# Patient Record
Sex: Male | Born: 1950
Health system: Southern US, Community
[De-identification: ages and names within clinical notes are randomized; demographics above are authoritative.]

## PROBLEM LIST (undated history)

## (undated) DIAGNOSIS — G43909 Migraine, unspecified, not intractable, without status migrainosus: Secondary | ICD-10-CM

## (undated) DIAGNOSIS — I10 Essential (primary) hypertension: Secondary | ICD-10-CM

## (undated) DIAGNOSIS — K219 Gastro-esophageal reflux disease without esophagitis: Secondary | ICD-10-CM

## (undated) DIAGNOSIS — M19019 Primary osteoarthritis, unspecified shoulder: Secondary | ICD-10-CM

## (undated) DIAGNOSIS — T8859XA Other complications of anesthesia, initial encounter: Secondary | ICD-10-CM

## (undated) DIAGNOSIS — N400 Enlarged prostate without lower urinary tract symptoms: Secondary | ICD-10-CM

## (undated) DIAGNOSIS — M1990 Unspecified osteoarthritis, unspecified site: Secondary | ICD-10-CM

## (undated) DIAGNOSIS — R112 Nausea with vomiting, unspecified: Secondary | ICD-10-CM

## (undated) DIAGNOSIS — E785 Hyperlipidemia, unspecified: Secondary | ICD-10-CM

## (undated) DIAGNOSIS — Z9889 Other specified postprocedural states: Secondary | ICD-10-CM

## (undated) DIAGNOSIS — G4730 Sleep apnea, unspecified: Secondary | ICD-10-CM

## (undated) DIAGNOSIS — T4145XA Adverse effect of unspecified anesthetic, initial encounter: Secondary | ICD-10-CM

## (undated) HISTORY — DX: Benign prostatic hyperplasia without lower urinary tract symptoms: N40.0

## (undated) HISTORY — DX: Hyperlipidemia, unspecified: E78.5

## (undated) HISTORY — DX: Essential (primary) hypertension: I10

## (undated) HISTORY — PX: KNEE SURGERY: SHX244

## (undated) HISTORY — PX: CHOLECYSTECTOMY: SHX55

## (undated) HISTORY — DX: Unspecified osteoarthritis, unspecified site: M19.90

## (undated) HISTORY — DX: Sleep apnea, unspecified: G47.30

## (undated) HISTORY — DX: Gastro-esophageal reflux disease without esophagitis: K21.9

## (undated) HISTORY — PX: INGUINAL HERNIA REPAIR: SUR1180

## (undated) HISTORY — DX: Migraine, unspecified, not intractable, without status migrainosus: G43.909

## (undated) HISTORY — DX: Morbid (severe) obesity due to excess calories: E66.01

---

## 2002-01-08 ENCOUNTER — Ambulatory Visit (HOSPITAL_COMMUNITY): Admission: RE | Admit: 2002-01-08 | Discharge: 2002-01-08 | Payer: Self-pay | Admitting: General Surgery

## 2002-01-08 ENCOUNTER — Encounter: Payer: Self-pay | Admitting: General Surgery

## 2003-06-14 ENCOUNTER — Ambulatory Visit (HOSPITAL_COMMUNITY): Admission: RE | Admit: 2003-06-14 | Discharge: 2003-06-14 | Payer: Self-pay | Admitting: Family Medicine

## 2003-07-09 ENCOUNTER — Ambulatory Visit (HOSPITAL_COMMUNITY): Admission: RE | Admit: 2003-07-09 | Discharge: 2003-07-10 | Payer: Self-pay

## 2003-11-11 ENCOUNTER — Observation Stay (HOSPITAL_COMMUNITY): Admission: RE | Admit: 2003-11-11 | Discharge: 2003-11-12 | Payer: Self-pay | Admitting: Orthopedic Surgery

## 2003-11-19 ENCOUNTER — Ambulatory Visit (HOSPITAL_BASED_OUTPATIENT_CLINIC_OR_DEPARTMENT_OTHER): Admission: RE | Admit: 2003-11-19 | Discharge: 2003-11-19 | Payer: Self-pay | Admitting: Otolaryngology

## 2010-12-21 ENCOUNTER — Ambulatory Visit: Payer: Self-pay | Admitting: Medical

## 2011-08-08 ENCOUNTER — Encounter: Payer: Self-pay | Admitting: Cardiology

## 2011-08-08 ENCOUNTER — Ambulatory Visit (INDEPENDENT_AMBULATORY_CARE_PROVIDER_SITE_OTHER): Payer: BC Managed Care – PPO | Admitting: Cardiology

## 2011-08-08 VITALS — BP 140/100 | HR 77 | Ht 70.0 in | Wt 304.0 lb

## 2011-08-08 DIAGNOSIS — E785 Hyperlipidemia, unspecified: Secondary | ICD-10-CM

## 2011-08-08 DIAGNOSIS — I452 Bifascicular block: Secondary | ICD-10-CM

## 2011-08-08 DIAGNOSIS — I447 Left bundle-branch block, unspecified: Secondary | ICD-10-CM

## 2011-08-08 DIAGNOSIS — I10 Essential (primary) hypertension: Secondary | ICD-10-CM

## 2011-08-08 MED ORDER — METOPROLOL TARTRATE 25 MG PO TABS: 25.0000 mg | ORAL_TABLET | Freq: Two times a day (BID) | ORAL | Status: AC

## 2011-08-08 NOTE — Progress Notes (Signed)
HPI The patient presents for evaluation of an abnormal EKG. He was found recently to have a left bundle branch block. This was not present on her previous EKG. He has not had any cardiac diagnosis in the past. He does have long-standing hypertension and sleep apnea. He did have a stress test in 2001. This is a treadmill test and he subsequently had a stress perfusion study though I don't have these results. He reports that the perfusion study was normal. He has not had any acute cardiovascular symptoms. He denies any chest pressure, or neck discomfort. He does not have any palpitations, presyncope or syncope. He denies any PND or orthopnea. Unfortunately he is very limited because of joint pains. He also describes diffuse muscle aches with limited activity. He does have some discomfort occasionally in his jaw or teeth. He does say he grinds his teeth.  Allergies  Allergen Reactions  . Allopurinol   . Sulfa Drugs Cross Reactors     Current Outpatient Prescriptions  Medication Sig Dispense Refill  . amitriptyline (ELAVIL) 50 MG tablet Take 50 mg by mouth at bedtime.      Marland Kitchen amLODipine (NORVASC) 10 MG tablet       . aspirin 81 MG tablet Take 81 mg by mouth daily.      . butabarbital (BUTISOL) 50 MG TABS Take 50 mg by mouth.      . butalbital-acetaminophen-caffeine (FIORICET, ESGIC) 50-325-40 MG per tablet       . CALCIUM PO Take 600 mg by mouth 2 (two) times daily.      . Cholecalciferol (VITAMIN D-3 PO) Take by mouth daily.      . cloNIDine (CATAPRES) 0.1 MG tablet       . DEPO-TESTOSTERONE 200 MG/ML injection       . hydrochlorothiazide (HYDRODIURIL) 25 MG tablet Take 25 mg by mouth daily.      Marland Kitchen HYDROcodone-acetaminophen (NORCO) 5-325 MG per tablet Take 1 tablet by mouth every 6 (six) hours as needed.      Marland Kitchen lisinopril (PRINIVIL,ZESTRIL) 40 MG tablet       . meloxicam (MOBIC) 15 MG tablet Take 15 mg by mouth daily.      . Multiple Vitamin (MULTIVITAMIN) tablet Take 1 tablet by mouth daily.       Marland Kitchen omega-3 acid ethyl esters (LOVAZA) 1 G capsule Take 2 g by mouth daily.      Marland Kitchen omeprazole (PRILOSEC) 20 MG capsule Take 20 mg by mouth daily.      . potassium chloride SA (K-DUR,KLOR-CON) 20 MEQ tablet Take 20 mEq by mouth 2 (two) times daily.      . Tamsulosin HCl (FLOMAX) 0.4 MG CAPS       . vardenafil (LEVITRA) 20 MG tablet Take 20 mg by mouth daily as needed.        Past Medical History  Diagnosis Date  . Sleep apnea   . Morbid obesity   . HTN (hypertension)   . GERD (gastroesophageal reflux disease)   . Hyperlipidemia   . Migraines   . Osteoarthritis   . BPH (benign prostatic hyperplasia)     Past Surgical History  Procedure Date  . Knee surgery     x 2  . Inguinal hernia repair     x 2  . Cholecystectomy     Family History  Problem Relation Age of Onset  . Coronary artery disease Father 84  . Stroke Mother 32    Died age 3  . Hypertension  Mother     History   Social History  . Marital Status: Unknown    Spouse Name: N/A    Number of Children: N/A  . Years of Education: N/A   Occupational History  . Teacher    Social History Main Topics  . Smoking status: Never Smoker   . Smokeless tobacco: Not on file  . Alcohol Use: Not on file  . Drug Use: Not on file  . Sexually Active: Not on file   Other Topics Concern  . 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    ROS:  Postive  dizziness, reflux, joint pains, seasonal allergies, edema. Forty pound intentional weight loss.  Otherwise as stated in the history of present illness and negative for all other systems.  PHYSICAL EXAM BP 140/100  Pulse 77  Ht 5\' 10"  (1.778 m)  Wt 304 lb (137.893 kg)  BMI 43.62 kg/m2 GENERAL:  Well appearing HEENT:  Pupils equal round and reactive, fundi not visualized, oral mucosa unremarkable NECK:  No jugular venous distention, waveform within normal limits, carotid upstroke brisk and symmetric, no bruits, no thyromegaly LYMPHATICS:  No  cervical, inguinal adenopathy LUNGS:  Clear to auscultation bilaterally BACK:  No CVA tenderness CHEST:  Unremarkable HEART:  PMI not displaced or sustained,S1 and S2 within normal limits, no S3, no S4, no clicks, no rubs, no murmurs ABD:  Flat, positive bowel sounds normal in frequency in pitch, no bruits, no rebound, no guarding, no midline pulsatile mass, no hepatomegaly, no splenomegaly EXT:  2 plus pulses throughout, no edema, no cyanosis no clubbing SKIN:  No rashes no nodules NEURO:  Cranial nerves II through XII grossly intact, motor grossly intact throughout PSYCH:  Cognitively intact, oriented to person place and time  EKG:  Sinus rhythm, rate 77, left bundle branch block. The LBBB is new compared with an EKG in 2001.  08/08/2011     ASSESSMENT AND PLAN

## 2011-08-08 NOTE — Patient Instructions (Signed)
The current medical regimen is effective;  continue present plan and medications.  Non-Cardiac CT Angiography (CTA), is a special type of CT scan that uses a computer to produce multi-dimensional views of major blood vessels throughout the body. In CT angiography, a contrast material is injected through an IV to help visualize the blood vessels.  Please take metoprolol 25 mg one twice a day start 2 days prior to your procedure.  Continue all other medications as listed.  Follow up as needed.

## 2011-08-08 NOTE — Assessment & Plan Note (Signed)
His blood pressure is elevated today. However, I reviewed previous blood pressures and these have not been high. He takes his blood pressure at home and is well controlled. He will keep a blood pressure diary and continue with weight loss. No change in therapy is indicated.

## 2011-08-08 NOTE — Assessment & Plan Note (Signed)
I congratulated him on his weight loss to date. I encouraged more of the same.

## 2011-08-08 NOTE — Assessment & Plan Note (Signed)
His last LDL was 156. His HDL 39. He has not tolerated statins.  This will be followed by Redmond Baseman, MD with diet and fish oil.

## 2011-08-08 NOTE — Assessment & Plan Note (Signed)
He has a new left bundle branch block though there are no recent EKGs. He has cardiovascular risk factors and some jaw discomfort. Given this I need to exclude obstructive coronary disease. He has baseline EKG abnormality as above and would not be an exercise. I will suggest for him coronary CT angiography to exclude obstructive coronary disease. He needs primary risk reduction.

## 2011-08-09 ENCOUNTER — Other Ambulatory Visit: Payer: Self-pay | Admitting: Cardiology

## 2011-08-09 DIAGNOSIS — I447 Left bundle-branch block, unspecified: Secondary | ICD-10-CM

## 2011-08-13 ENCOUNTER — Other Ambulatory Visit: Payer: Self-pay | Admitting: *Deleted

## 2011-08-13 DIAGNOSIS — I454 Nonspecific intraventricular block: Secondary | ICD-10-CM

## 2011-08-28 ENCOUNTER — Encounter (HOSPITAL_COMMUNITY): Payer: BC Managed Care – PPO

## 2011-08-28 ENCOUNTER — Ambulatory Visit (HOSPITAL_COMMUNITY): Payer: BC Managed Care – PPO | Attending: Cardiology | Admitting: Radiology

## 2011-08-28 VITALS — BP 118/77 | Ht 71.0 in | Wt 295.0 lb

## 2011-08-28 DIAGNOSIS — E669 Obesity, unspecified: Secondary | ICD-10-CM | POA: Insufficient documentation

## 2011-08-28 DIAGNOSIS — I454 Nonspecific intraventricular block: Secondary | ICD-10-CM

## 2011-08-28 DIAGNOSIS — R0602 Shortness of breath: Secondary | ICD-10-CM

## 2011-08-28 DIAGNOSIS — I447 Left bundle-branch block, unspecified: Secondary | ICD-10-CM | POA: Insufficient documentation

## 2011-08-28 DIAGNOSIS — R0989 Other specified symptoms and signs involving the circulatory and respiratory systems: Secondary | ICD-10-CM | POA: Insufficient documentation

## 2011-08-28 DIAGNOSIS — E785 Hyperlipidemia, unspecified: Secondary | ICD-10-CM | POA: Insufficient documentation

## 2011-08-28 DIAGNOSIS — Z8249 Family history of ischemic heart disease and other diseases of the circulatory system: Secondary | ICD-10-CM | POA: Insufficient documentation

## 2011-08-28 DIAGNOSIS — R9431 Abnormal electrocardiogram [ECG] [EKG]: Secondary | ICD-10-CM | POA: Insufficient documentation

## 2011-08-28 DIAGNOSIS — R42 Dizziness and giddiness: Secondary | ICD-10-CM | POA: Insufficient documentation

## 2011-08-28 DIAGNOSIS — R079 Chest pain, unspecified: Secondary | ICD-10-CM | POA: Insufficient documentation

## 2011-08-28 DIAGNOSIS — R0609 Other forms of dyspnea: Secondary | ICD-10-CM | POA: Insufficient documentation

## 2011-08-28 DIAGNOSIS — I10 Essential (primary) hypertension: Secondary | ICD-10-CM | POA: Insufficient documentation

## 2011-08-28 MED ORDER — ADENOSINE (DIAGNOSTIC) 3 MG/ML IV SOLN
0.5600 mg/kg | Freq: Once | INTRAVENOUS | Status: AC
  Administered 2011-08-28 (×2): 75 mg via INTRAVENOUS

## 2011-08-28 MED ORDER — TECHNETIUM TC 99M TETROFOSMIN IV KIT
32.9000 | PACK | Freq: Once | INTRAVENOUS | Status: AC | PRN
  Administered 2011-08-28: 32.9 via INTRAVENOUS

## 2011-08-28 NOTE — Progress Notes (Signed)
[facility] SITE 3 NUCLEAR MED [address] [place] [address] [phone]  Cardiology Nuclear Med Study  Joe Vance is a 61 y.o. male     MRN : [id number]     DOB: 04/23/1951  Procedure Date: 08/28/2011  Nuclear Med Background Indication for Stress Test:  Evaluation for Ischemia and Abnormal EKG History:  '01 OZH:YQMVHQ per pt. Cardiac Risk Factors: Family History - CAD, Hypertension, LBBB, Lipids and Obesity  Symptoms:  Chest Pain/"Pinching" (last episode of chest discomfort was about 2-weeks ago), Dizziness and DOE   Nuclear Pre-Procedure Caffeine/Decaff Intake:  None NPO After: 9:30pm   Lungs:  clear O2 Sat: 95% on room air. IV 0.9% NS with Angio Cath:  22g  IV Site: R Forearm  IV Started by:  Stanton Kidney, EMT-P  Chest Size (in):  56 Cup Size: n/a  Height: 5\' 11"  (1.803 m)  Weight:  295 lb (133.811 kg)  BMI:  Body mass index is 41.14 kg/(m^2). Tech Comments:  Metoprolol held this am, per patient.    Nuclear Med Study 1 or 2 day study: 2 day  Stress Test Type:  Adenosine  Reading MD: Cassell Clement, MD  Order Authorizing Provider:  Rollene Rotunda, MD  Resting Radionuclide: Technetium 4m Tetrofosmin  Resting Radionuclide Dose: 33.0 mCi 08/29/11  Stress Radionuclide:  Technetium 94m Tetrofosmin  Stress Radionuclide Dose: 32.9 mCi  08/28/11          Stress Protocol Rest HR: 63 Stress HR: 95  Rest BP: 118/77 Stress BP: 122/56  Exercise Time (min): n/a METS: n/a   Predicted Max HR: 162 bpm % Max HR: 58.64 bpm Rate Pressure Product: 46962   Dose of Adenosine (mg):  20.0 Dose of Lexiscan: n/a mg  Dose of Atropine (mg): n/a Dose of Dobutamine: n/a mcg/kg/min (at max HR)  Stress Test Technologist: Smiley Houseman, CMA-N  Nuclear Technologist:  Domenic Polite, CNMT     Rest Procedure:  Myocardial perfusion imaging was performed at rest 45 minutes following the intravenous administration of Technetium 11m Tetrofosmin.  Rest ECG: LBBB with aberrant  beats.  Stress Procedure:  The patient received IV adenosine at 140 mcg/kg/min for 4 minutes.  There were no significant changes with infusion; occasional aberrant beat noted.  He did c/o chest tightness, 10/10, with infusion.  Technetium 49m Tetrofosmin was injected at the 2 minute mark and quantitative spect images were obtained after a 45 minute delay.  Stress ECG: No significant change from baseline ECG  QPS Raw Data Images:  Normal; no motion artifact; normal heart/lung ratio. Stress Images:  Small, moderate severity mid to apical anteroseptal perfusion defect.  Rest Images:  Small, moderate severity mid to apical anteroseptal perfusion defect.  Subtraction (SDS):  Primarily fixed with some reversibility mid to apical anteroseptal perfusion defect.  Transient Ischemic Dilatation (Normal <1.22): 1.18 Lung/Heart Ratio (Normal <0.45):  0.39  Quantitative Gated Spect Images QGS EDV:  166 ml QGS ESV:  78 ml  Impression Exercise Capacity:  Adenosine study with no exercise. BP Response:  Normal blood pressure response. Clinical Symptoms:  Short of breath, chest tightness.  ECG Impression:  LBBB, no change with infusion.  Comparison with Prior Nuclear Study: No images to compare  Overall Impression:  Small, moderate severity mid to apical anteroseptal perfusion defect that is primarily fixed with some reversibility.  This could represent prior MI with some peri-infarction ischemia but cannot rule out artifact in setting of LBBB.  Overall low risk study.  Would consider followup  coronary CT angiogram if significant concern for obstructive CAD.   LV Ejection Fraction: 53%.  LV Wall Motion:  Mild septal hypokinesis may be secondary to LBBB.   Arles Rumbold [employer]

## 2011-08-29 ENCOUNTER — Ambulatory Visit (HOSPITAL_COMMUNITY): Payer: BC Managed Care – PPO | Attending: Cardiology

## 2011-08-29 DIAGNOSIS — R0989 Other specified symptoms and signs involving the circulatory and respiratory systems: Secondary | ICD-10-CM

## 2011-08-29 MED ORDER — TECHNETIUM TC 99M TETROFOSMIN IV KIT
30.0000 | PACK | Freq: Once | INTRAVENOUS | Status: AC | PRN
  Administered 2011-08-29: 30 via INTRAVENOUS

## 2012-12-26 ENCOUNTER — Ambulatory Visit (INDEPENDENT_AMBULATORY_CARE_PROVIDER_SITE_OTHER): Payer: BC Managed Care – PPO | Admitting: Family Medicine

## 2012-12-26 ENCOUNTER — Telehealth: Payer: Self-pay | Admitting: Family Medicine

## 2012-12-26 ENCOUNTER — Encounter: Payer: Self-pay | Admitting: Family Medicine

## 2012-12-26 VITALS — BP 149/90 | HR 76 | Temp 97.6°F | Wt 337.0 lb

## 2012-12-26 DIAGNOSIS — H6123 Impacted cerumen, bilateral: Secondary | ICD-10-CM

## 2012-12-26 DIAGNOSIS — H60399 Other infective otitis externa, unspecified ear: Secondary | ICD-10-CM

## 2012-12-26 DIAGNOSIS — H60393 Other infective otitis externa, bilateral: Secondary | ICD-10-CM

## 2012-12-26 DIAGNOSIS — H6120 Impacted cerumen, unspecified ear: Secondary | ICD-10-CM

## 2012-12-26 MED ORDER — NEOMYCIN-COLIST-HC-THONZONIUM 3.3-3-10-0.5 MG/ML OT SUSP: 3.0000 [drp] | Freq: Four times a day (QID) | OTIC | Status: DC

## 2012-12-26 NOTE — Progress Notes (Signed)
  Subjective:    Patient ID: Joe Vance, male    DOB: Jan 06, 1951, 62 y.o.   MRN: [id number]  HPI This 62 y.o. male presents for evaluation of decreased hearing acuity in bilateral ears. He states he went to the [place] [facility] and was told he has wax in his ears bilateral and he Is here for follow up.  He states he has been using debrox ear wax for the last few days.   Review of Systems No chest pain, SOB, HA, dizziness, vision change, N/V, diarrhea, constipation, dysuria, urinary urgency or frequency, myalgias, arthralgias or rash.     Objective:   Physical Exam  Vital signs noted  Well developed well nourished male.  HEENT - Head atraumatic Normocephalic                Eyes - PERRLA, Conjuctiva - clear Sclera- Clear EOMI                Ears - EAC's with cerumen impactions.                Nose - Nares patent                 Throat - oropharanx wnl Respiratory - Lungs CTA bilateral Cardiac - RRR S1 and S2 without murmur.      Assessment & Plan:  Cerumen impaction, bilateral - Plan: Ambulatory referral to ENT.  Unable to irrigate and clear ears and recommend tx With cortisporin and then refer to ENT.  Otitis, externa, infective, bilateral - Plan: neomycin-colistin-hydrocortisone-thonzonium (CORTISPORIN-TC) 3.07-28-08-0.5 MG/ML otic suspension, DISCONTINUED: neomycin-colistin-hydrocortisone-thonzonium (CORTISPORIN-TC) 3.07-28-08-0.5 MG/ML otic suspension

## 2012-12-26 NOTE — Telephone Encounter (Signed)
appt given for today 

## 2012-12-29 NOTE — Patient Instructions (Signed)
Cerumen Impaction A cerumen impaction is when the wax in your ear forms a plug. This plug usually causes reduced hearing. Sometimes it also causes an earache or dizziness. Removing a cerumen impaction can be difficult and painful. The wax sticks to the ear canal. The canal is sensitive and bleeds easily. If you try to remove a heavy wax buildup with a cotton tipped swab, you may push it in further. Irrigation with water, suction, and small ear curettes may be used to clear out the wax. If the impaction is fixed to the skin in the ear canal, ear drops may be needed for a few days to loosen the wax. People who build up a lot of wax frequently can use ear wax removal products available in your local drugstore. SEEK MEDICAL CARE IF:  You develop an earache, increased hearing loss, or marked dizziness. Document Released: 06/21/2004 Document Revised: 08/06/2011 Document Reviewed: 08/11/2009 ExitCare Patient Information 2014 [employer].  

## 2013-02-25 ENCOUNTER — Ambulatory Visit (INDEPENDENT_AMBULATORY_CARE_PROVIDER_SITE_OTHER): Payer: BC Managed Care – PPO | Admitting: Family Medicine

## 2013-02-25 ENCOUNTER — Encounter: Payer: Self-pay | Admitting: Family Medicine

## 2013-02-25 VITALS — BP 145/85 | HR 65 | Temp 99.2°F | Ht 71.0 in | Wt 337.0 lb

## 2013-02-25 DIAGNOSIS — R319 Hematuria, unspecified: Secondary | ICD-10-CM

## 2013-02-25 MED ORDER — CIPROFLOXACIN HCL 500 MG PO TABS: 500.0000 mg | ORAL_TABLET | Freq: Two times a day (BID) | ORAL | Status: DC

## 2013-02-25 NOTE — Patient Instructions (Signed)
Hematuria, Adult Hematuria (blood in your urine) can be caused by a bladder infection (cystitis), kidney infection (pyelonephritis), prostate infection (prostatitis), or kidney stone. Infections will usually respond to antibiotics (medications which kill germs), and a kidney stone will usually pass through your urine without further treatment. If you were put on antibiotics, take all the medicine until gone. You may feel better in a few days, but take all of your medicine or the infection may not respond and become more difficult to treat. If antibiotics were not given, an infection did not cause the blood in the urine. A further work up to find out the reason may be needed. HOME CARE INSTRUCTIONS   Drink lots of fluid, 3 to 4 quarts a day. If you have been diagnosed with an infection, cranberry juice is especially recommended, in addition to large amounts of water.  Avoid caffeine, tea, and carbonated beverages, because they tend to irritate the bladder.  Avoid alcohol as it may irritate the prostate.  Only take over-the-counter or prescription medicines for pain, discomfort, or fever as directed by your caregiver.  If you have been diagnosed with a kidney stone follow your caregivers instructions regarding straining your urine to catch the stone. TO PREVENT FURTHER INFECTIONS:  Empty the bladder often. Avoid holding urine for long periods of time.  After a bowel movement, women should cleanse front to back. Use each tissue only once.  Empty the bladder before and after sexual intercourse if you are a male.  Return to your caregiver if you develop back pain, fever, nausea (feeling sick to your stomach), vomiting, or your symptoms (problems) are not better in 3 days. Return sooner if you are getting worse. If you have been requested to return for further testing make sure to keep your appointments. If an infection is not the cause of blood in your urine, X-rays may be required. Your caregiver  will discuss this with you. SEEK IMMEDIATE MEDICAL CARE IF:   You have a persistent fever over 102 F (38.9 C).  You develop severe vomiting and are unable to keep the medication down.  You develop severe back or abdominal pain despite taking your medications.  You begin passing a large amount of blood or clots in your urine.  You feel extremely weak or faint, or pass out. MAKE SURE YOU:   Understand these instructions.  Will watch your condition.  Will get help right away if you are not doing well or get worse. Document Released: 05/14/2005 Document Revised: 08/06/2011 Document Reviewed: 01/01/2008 ExitCare Patient Information 2014 [employer].  

## 2013-02-25 NOTE — Progress Notes (Signed)
  Subjective:    Patient ID: Norman Piacentini, male    DOB: 1950/12/09, 62 y.o.   MRN: [id number]  HPI  This 62 y.o. male presents for evaluation of c/o blood in urine.  He was gong to cough and He held his penis and urethra and he sneezed and then started to bleed.  He has been  Having less bleeding and pain.   Review of Systems No chest pain, SOB, HA, dizziness, vision change, N/V, diarrhea, constipation, dysuria, urinary urgency or frequency, myalgias, arthralgias or rash.     Objective:   Physical Exam Vital signs noted  Well developed well nourished male.  HEENT - Head atraumatic Normocephalic                Eyes - PERRLA, Conjuctiva - clear Sclera- Clear EOMI                Ears - EAC's Wnl TM's Wnl Gross Hearing WNL                Nose - Nares patent                 Throat - oropharanx wnl Respiratory - Lungs CTA bilateral Cardiac - RRR S1 and S2 without murmur GI - Abdomen soft Nontender and bowel sounds active x 4 Extremities - No edema. Neuro - Grossly intact.      Assessment & Plan:  Hematuria - Plan: ciprofloxacin (CIPRO) 500 MG tablet po bid x 7 days. Follow up if not better.  Deatra Canter FNP

## 2014-10-15 ENCOUNTER — Encounter: Payer: Self-pay | Admitting: Nurse Practitioner

## 2014-10-15 ENCOUNTER — Ambulatory Visit (INDEPENDENT_AMBULATORY_CARE_PROVIDER_SITE_OTHER): Payer: BLUE CROSS/BLUE SHIELD

## 2014-10-15 ENCOUNTER — Other Ambulatory Visit: Payer: Self-pay | Admitting: Nurse Practitioner

## 2014-10-15 ENCOUNTER — Ambulatory Visit (INDEPENDENT_AMBULATORY_CARE_PROVIDER_SITE_OTHER): Payer: BLUE CROSS/BLUE SHIELD | Admitting: Nurse Practitioner

## 2014-10-15 VITALS — BP 153/93 | HR 89 | Temp 98.1°F | Ht 71.0 in | Wt 349.0 lb

## 2014-10-15 DIAGNOSIS — R1084 Generalized abdominal pain: Secondary | ICD-10-CM

## 2014-10-15 DIAGNOSIS — K8000 Calculus of gallbladder with acute cholecystitis without obstruction: Secondary | ICD-10-CM

## 2014-10-15 DIAGNOSIS — R5383 Other fatigue: Secondary | ICD-10-CM | POA: Diagnosis not present

## 2014-10-15 DIAGNOSIS — R1110 Vomiting, unspecified: Secondary | ICD-10-CM

## 2014-10-15 NOTE — Progress Notes (Signed)
   Subjective:    Patient ID: Joe Vance, male    DOB: 03/20/1951, 64 y.o.   MRN: [id number]  HPI patient in c/o nausea and fatigue- not sleeping good at night. Has lots of medical problems and never feels real good anyway. Started feeling worse several days ago-denies any recent tick bites.     Review of Systems  Constitutional: Positive for fatigue. Negative for fever, chills and appetite change.  HENT: Negative.   Respiratory: Negative.   Cardiovascular: Positive for leg swelling.  Gastrointestinal: Positive for nausea and abdominal pain (always has abdominal pain). Negative for vomiting, diarrhea and constipation.  Genitourinary: Negative.   Musculoskeletal: Positive for arthralgias.  Neurological: Negative.   Psychiatric/Behavioral: Negative.        Objective:   Physical Exam  Constitutional: He is oriented to person, place, and time. He appears well-developed and well-nourished.  HENT:  Right Ear: External ear normal.  Left Ear: External ear normal.  Nose: Nose normal.  Mouth/Throat: Oropharynx is clear and moist.  Cardiovascular: Normal rate and normal heart sounds.   Pulmonary/Chest: Effort normal and breath sounds normal.  Abdominal: Soft. Bowel sounds are normal. Tenderness: mild tenderness mid epigastric.  Neurological: He is alert and oriented to person, place, and time. He has normal reflexes.  Skin: Skin is warm.  Psychiatric: He has a normal mood and affect. His behavior is normal. Judgment and thought content normal.   BP 153/93 mmHg  Pulse 89  Temp(Src) 98.1 F (36.7 C) (Oral)  Ht _0  (1.803 m)  Wt 349 lb (158.305 kg)  BMI 48.70 kg/m2  KUB- gas and stool in right colon-Preliminary reading by Ronnald Collum, FNP  [facility]  EKG- sinus rhythm with Cruz Condon, FNP       Assessment & Plan:   1. Generalized abdominal pain   2. Intractable vomiting with nausea, vomiting of unspecified type   3. Other fatigue    Orders Placed This Encounter    Procedures  . DG Abd 1 View    Standing Status: Future     Number of Occurrences: 1     Standing Expiration Date: 12/15/2015    Order Specific Question:  Reason for Exam (SYMPTOM  OR DIAGNOSIS REQUIRED)    Answer:  abd pain    Order Specific Question:  Preferred imaging location?    Answer:  Internal  . Lyme Ab/Western Blot Reflex  . Rocky mtn spotted fvr abs pnl(IgG+IgM)  . Thyroid Panel With TSH  . Anemia Profile B  . CMP14+EGFR  . EKG 12-Lead   Labs pending Rest Force fluids Will talk once get labs back  [place], FNP

## 2014-10-15 NOTE — Patient Instructions (Signed)

## 2014-10-19 LAB — CMP14+EGFR
ALK PHOS: 69 IU/L (ref 39–117)
ALT: 62 IU/L — ABNORMAL HIGH (ref 0–44)
AST: 47 IU/L — AB (ref 0–40)
Albumin/Globulin Ratio: 1.8 (ref 1.1–2.5)
Albumin: 4.5 g/dL (ref 3.6–4.8)
BILIRUBIN TOTAL: 0.4 mg/dL (ref 0.0–1.2)
BUN / CREAT RATIO: 17 (ref 10–22)
BUN: 12 mg/dL (ref 8–27)
CO2: 24 mmol/L (ref 18–29)
CREATININE: 0.71 mg/dL — AB (ref 0.76–1.27)
Calcium: 9.9 mg/dL (ref 8.6–10.2)
Chloride: 102 mmol/L (ref 97–108)
GFR calc Af Amer: 117 mL/min/{1.73_m2} (ref >59)
GFR calc non Af Amer: 101 mL/min/{1.73_m2} (ref >59)
GLOBULIN, TOTAL: 2.5 g/dL (ref 1.5–4.5)
Glucose: 115 mg/dL — ABNORMAL HIGH (ref 65–99)
Potassium: 4.3 mmol/L (ref 3.5–5.2)
SODIUM: 143 mmol/L (ref 134–144)
TOTAL PROTEIN: 7 g/dL (ref 6.0–8.5)

## 2014-10-19 LAB — ANEMIA PROFILE B
BASOS ABS: 0.1 10*3/uL (ref 0.0–0.2)
Basos: 1 %
EOS (ABSOLUTE): 0.3 10*3/uL (ref 0.0–0.4)
Eos: 4 %
FERRITIN: 35 ng/mL (ref 30–400)
HEMATOCRIT: 40.5 % (ref 37.5–51.0)
Hemoglobin: 14.4 g/dL (ref 12.6–17.7)
IMMATURE GRANULOCYTES: 0 %
IRON: 91 ug/dL (ref 38–169)
Immature Grans (Abs): 0 10*3/uL (ref 0.0–0.1)
Iron Saturation: 28 % (ref 15–55)
Lymphocytes Absolute: 1.7 10*3/uL (ref 0.7–3.1)
Lymphs: 22 %
MCH: 32.2 pg (ref 26.6–33.0)
MCHC: 35.6 g/dL (ref 31.5–35.7)
MCV: 91 fL (ref 79–97)
MONOCYTES: 7 %
Monocytes Absolute: 0.6 10*3/uL (ref 0.1–0.9)
NEUTROS PCT: 66 %
Neutrophils Absolute: 5 10*3/uL (ref 1.4–7.0)
Platelets: 237 10*3/uL (ref 150–379)
RBC: 4.47 x10E6/uL (ref 4.14–5.80)
RDW: 14.6 % (ref 12.3–15.4)
Retic Ct Pct: 1.5 % (ref 0.6–2.6)
Total Iron Binding Capacity: 324 ug/dL (ref 250–450)
UIBC: 233 ug/dL (ref 111–343)
Vitamin B-12: 1318 pg/mL — ABNORMAL HIGH (ref 211–946)
WBC: 7.6 10*3/uL (ref 3.4–10.8)

## 2014-10-19 LAB — ROCKY MTN SPOTTED FVR ABS PNL(IGG+IGM)
RMSF IGM: 0.2 {index} (ref 0.00–0.89)
RMSF IgG: NEGATIVE

## 2014-10-19 LAB — LYME AB/WESTERN BLOT REFLEX: LYME DISEASE AB, QUANT, IGM: <0.8 index (ref 0.00–0.79)

## 2014-10-19 LAB — THYROID PANEL WITH TSH
FREE THYROXINE INDEX: 1.7 (ref 1.2–4.9)
T3 Uptake Ratio: 28 % (ref 24–39)
T4 TOTAL: 6 ug/dL (ref 4.5–12.0)
TSH: 3.07 u[IU]/mL (ref 0.450–4.500)

## 2014-10-22 ENCOUNTER — Encounter: Payer: Self-pay | Admitting: Physician Assistant

## 2014-10-22 ENCOUNTER — Ambulatory Visit (INDEPENDENT_AMBULATORY_CARE_PROVIDER_SITE_OTHER): Payer: BLUE CROSS/BLUE SHIELD | Admitting: Physician Assistant

## 2014-10-22 VITALS — BP 148/88 | HR 75 | Temp 98.5°F | Ht 71.0 in | Wt 349.0 lb

## 2014-10-22 DIAGNOSIS — J302 Other seasonal allergic rhinitis: Secondary | ICD-10-CM | POA: Diagnosis not present

## 2014-10-22 DIAGNOSIS — K219 Gastro-esophageal reflux disease without esophagitis: Secondary | ICD-10-CM

## 2014-10-22 NOTE — Patient Instructions (Signed)
Take Zyrtec 10mg  daily for post nasal drip and cough prevent.   Allergic Rhinitis Allergic rhinitis is when the mucous membranes in the nose respond to allergens. Allergens are particles in the air that cause your body to have an allergic reaction. This causes you to release allergic antibodies. Through a chain of events, these eventually cause you to release histamine into the blood stream. Although meant to protect the body, it is this release of histamine that causes your discomfort, such as frequent sneezing, congestion, and an itchy, runny nose.  CAUSES  Seasonal allergic rhinitis (hay fever) is caused by pollen allergens that may come from grasses, trees, and weeds. Year-round allergic rhinitis (perennial allergic rhinitis) is caused by allergens such as house dust mites, pet dander, and mold spores.  SYMPTOMS   Nasal stuffiness (congestion).  Itchy, runny nose with sneezing and tearing of the eyes. DIAGNOSIS  Your health care provider can help you determine the allergen or allergens that trigger your symptoms. If you and your health care provider are unable to determine the allergen, skin or blood testing may be used. TREATMENT  Allergic rhinitis does not have a cure, but it can be controlled by:  Medicines and allergy shots (immunotherapy).  Avoiding the allergen. Hay fever may often be treated with antihistamines in pill or nasal spray forms. Antihistamines block the effects of histamine. There are over-the-counter medicines that may help with nasal congestion and swelling around the eyes. Check with your health care provider before taking or giving this medicine.  If avoiding the allergen or the medicine prescribed do not work, there are many new medicines your health care provider can prescribe. Stronger medicine may be used if initial measures are ineffective. Desensitizing injections can be used if medicine and avoidance does not work. Desensitization is when a patient is given  ongoing shots until the body becomes less sensitive to the allergen. Make sure you follow up with your health care provider if problems continue. HOME CARE INSTRUCTIONS It is not possible to completely avoid allergens, but you can reduce your symptoms by taking steps to limit your exposure to them. It helps to know exactly what you are allergic to so that you can avoid your specific triggers. SEEK MEDICAL CARE IF:   You have a fever.  You develop a cough that does not stop easily (persistent).  You have shortness of breath.  You start wheezing.  Symptoms interfere with normal daily activities. Document Released: 02/06/2001 Document Revised: 05/19/2013 Document Reviewed: 01/19/2013 [facility] Patient Information 2015 [employer] [place]. This information is not intended to replace advice given to you by your health care provider. Make sure you discuss any questions you have with your health care provider.

## 2014-10-22 NOTE — Progress Notes (Signed)
   Subjective:    Patient ID: Joe Vance, male    DOB: May 03, 1951, 64 y.o.   MRN: [id number]  HPI 64 y/o morbidly obese male presents stating that "he's mostly here to get a doctors note because he was told to by his boss." He states that hes had epigastric pain with associated nausea x 2 weeks, mainly in the morning. [facility] and was given Maalox with mild relief. He has started eating oatmeal in the morning which has made no change. He states that he is feeling better now but needs a doctors excuse and needed to be seen today or he will not get pain on Monday for Memorial day.   He also states that he developed a cough yesterday. Has not tried any medication for the cough.      Review of Systems  HENT: Positive for postnasal drip.   Respiratory: Positive for cough (intermittent). Negative for shortness of breath.        Objective:   Physical Exam  Constitutional: He is oriented to person, place, and time. He appears well-developed and well-nourished.  Morbidly obese    HENT:  Right Ear: External ear normal.  Left Ear: External ear normal.  Mouth/Throat: No oropharyngeal exudate.  Nasal turbinates erythematous and boggy bilaterally   Cardiovascular: Normal rate, regular rhythm and normal heart sounds.   Abdominal: He exhibits no distension. There is no tenderness. There is no rebound and no guarding.  Neurological: He is alert and oriented to person, place, and time.  Psychiatric: He has a normal mood and affect. His behavior is normal. Judgment and thought content normal.  Nursing note and vitals reviewed.         Assessment & Plan:   1. Gastroesophageal reflux disease, esophagitis presence not specified - continue Omeprazole 20mg  as prescribed.  - Encouraged good eating habits  2. Other seasonal allergic rhinitis - Zyrtec 10mg  q day - Cool mist humidifier - otc flonase - avoid allergens   RTO prn   Tiffany A. Chauncey ReadingGann PA-C

## 2014-10-26 ENCOUNTER — Telehealth: Payer: Self-pay | Admitting: Physician Assistant

## 2014-10-26 ENCOUNTER — Ambulatory Visit (HOSPITAL_COMMUNITY): Admission: RE | Admit: 2014-10-26 | Payer: Self-pay | Source: Ambulatory Visit

## 2014-10-26 NOTE — Telephone Encounter (Signed)
Denied. He stated Friday that his symptoms have resolved and he needed a work note for that day so he could get paid on Memorial day, documented in note. He has had test in our office, which were negative. [facility]. [facility] regarding the symptoms he is having. I cannot give him a sick note for today.   Tiffany A. Chauncey ReadingGann PA-C

## 2014-10-26 NOTE — Telephone Encounter (Signed)
Patient aware. Asked for appt but then decided not to come in.

## 2015-03-09 ENCOUNTER — Telehealth: Payer: Self-pay | Admitting: Family Medicine

## 2015-12-20 ENCOUNTER — Encounter: Payer: Self-pay | Admitting: Family Medicine

## 2015-12-20 ENCOUNTER — Ambulatory Visit (INDEPENDENT_AMBULATORY_CARE_PROVIDER_SITE_OTHER): Payer: BLUE CROSS/BLUE SHIELD | Admitting: Family Medicine

## 2015-12-20 VITALS — BP 153/90 | HR 80 | Temp 97.5°F | Ht 71.0 in | Wt 340.2 lb

## 2015-12-20 DIAGNOSIS — H6123 Impacted cerumen, bilateral: Secondary | ICD-10-CM

## 2015-12-20 DIAGNOSIS — I10 Essential (primary) hypertension: Secondary | ICD-10-CM | POA: Diagnosis not present

## 2015-12-20 MED ORDER — METOPROLOL SUCCINATE ER 50 MG PO TB24: 50.0000 mg | ORAL_TABLET | Freq: Every day | ORAL | 3 refills | Status: DC

## 2015-12-20 NOTE — Patient Instructions (Addendum)
Great to meet you!  Lets add on metoprolol 50 mg once daily.   Continue your other medications as prescribed currently  We will call with results within 1 week

## 2015-12-20 NOTE — Progress Notes (Signed)
   HPI  Patient presents today with elevated blood pressure  He states that his blood pressure is been elevated over the last 3-4 weeks to 921J and 94R systolic over 740-814 diastolic. He denies any headache, palpitations, leg edema, or new dyspnea. He has had momentary chest discomfort relieved with belching he attributes to indigestion.  He also notes itching in his bilateral ears, he previously had been cleaned out of ENT and did well with that. He had multiple ear irrigations in our clinic with no good results previously. He states his hearing is not quite as good as it used to be.   PMH: Smoking status noted ROS: Per HPI  Objective: BP (!) 153/90   Pulse 80   Temp 97.5 F (36.4 C) (Oral)   Ht '5\' 11"'$  (1.803 m)   Wt (!) 340 lb 3.2 oz (154.3 kg)   BMI 47.45 kg/m  Gen: NAD, alert, cooperative with exam HEENT: NCAT, TMs obscured bilaterally by cerumen, however no large chunks that appears to be several smaller pieces of cerumen bilaterally CV: RRR, good S1/S2, no murmur Resp: CTABL, no wheezes, non-labored Ext: No edema, warm Neuro: Alert and oriented, No gross deficits  Assessment and plan:  # Hypertension Elevated today He is on a higher than typical dose lisinopril, 80 mg, continue cautiously Continue amlodipine, clonidine, and HCTZ Adding beta blocker today, metoprolol  succinate 50 mg daily If possible considered discontinuing clonidine in the future  # An impaction Bilateral Recommended conservative therapy to start with including the debrox drops and/or drops of mineral oil a few times a week, stop q tips     Orders Placed This Encounter  Procedures  . CMP14+EGFR  . CBC with Differential     Laroy Apple, MD [facility] 12/20/2015, 3:23 PM

## 2015-12-21 LAB — CMP14+EGFR
ALT: 61 IU/L — AB (ref 0–44)
AST: 63 IU/L — AB (ref 0–40)
Albumin/Globulin Ratio: 1.8 (ref 1.2–2.2)
Albumin: 4.8 g/dL (ref 3.6–4.8)
Alkaline Phosphatase: 58 IU/L (ref 39–117)
BUN/Creatinine Ratio: 16 (ref 10–24)
BUN: 13 mg/dL (ref 8–27)
Bilirubin Total: 0.8 mg/dL (ref 0.0–1.2)
CALCIUM: 10 mg/dL (ref 8.6–10.2)
CO2: 23 mmol/L (ref 18–29)
CREATININE: 0.82 mg/dL (ref 0.76–1.27)
Chloride: 98 mmol/L (ref 96–106)
GFR calc Af Amer: 110 mL/min/{1.73_m2} (ref >59)
GFR, EST NON AFRICAN AMERICAN: 95 mL/min/{1.73_m2} (ref >59)
GLUCOSE: 85 mg/dL (ref 65–99)
Globulin, Total: 2.6 g/dL (ref 1.5–4.5)
Potassium: 3.9 mmol/L (ref 3.5–5.2)
Sodium: 138 mmol/L (ref 134–144)
Total Protein: 7.4 g/dL (ref 6.0–8.5)

## 2015-12-21 LAB — CBC WITH DIFFERENTIAL/PLATELET
BASOS: 1 %
Basophils Absolute: 0.1 10*3/uL (ref 0.0–0.2)
EOS (ABSOLUTE): 0.3 10*3/uL (ref 0.0–0.4)
EOS: 5 %
HEMATOCRIT: 43.2 % (ref 37.5–51.0)
Hemoglobin: 15.3 g/dL (ref 12.6–17.7)
IMMATURE GRANS (ABS): 0 10*3/uL (ref 0.0–0.1)
IMMATURE GRANULOCYTES: 0 %
LYMPHS: 32 %
Lymphocytes Absolute: 1.9 10*3/uL (ref 0.7–3.1)
MCH: 33 pg (ref 26.6–33.0)
MCHC: 35.4 g/dL (ref 31.5–35.7)
MCV: 93 fL (ref 79–97)
Monocytes Absolute: 0.5 10*3/uL (ref 0.1–0.9)
Monocytes: 9 %
NEUTROS PCT: 53 %
Neutrophils Absolute: 3.1 10*3/uL (ref 1.4–7.0)
PLATELETS: 227 10*3/uL (ref 150–379)
RBC: 4.64 x10E6/uL (ref 4.14–5.80)
RDW: 13.8 % (ref 12.3–15.4)
WBC: 5.9 10*3/uL (ref 3.4–10.8)

## 2016-01-06 ENCOUNTER — Ambulatory Visit (INDEPENDENT_AMBULATORY_CARE_PROVIDER_SITE_OTHER): Payer: BLUE CROSS/BLUE SHIELD | Admitting: Family Medicine

## 2016-01-06 ENCOUNTER — Encounter: Payer: Self-pay | Admitting: Family Medicine

## 2016-01-06 VITALS — BP 144/80 | HR 63 | Temp 97.3°F | Ht 71.0 in | Wt 339.6 lb

## 2016-01-06 DIAGNOSIS — I10 Essential (primary) hypertension: Secondary | ICD-10-CM

## 2016-01-06 MED ORDER — METOPROLOL SUCCINATE ER 100 MG PO TB24: 100.0000 mg | ORAL_TABLET | Freq: Every day | ORAL | 5 refills | Status: DC

## 2016-01-06 NOTE — Patient Instructions (Addendum)
Great to see you!  I am increasing the metoprolol  Lets see you again in 1 month, PLease keep a blood pressure log

## 2016-01-06 NOTE — Progress Notes (Signed)
   HPI  Patient presents today here for follow-up hypertension.  Patient states he is tolerating the medication well. His blood pressure at home is frequently 140 and 150 over 80s and low 90s diastolic. He has had some as high as since starting the metoprolol. No dizziness or feeling faint since taking metoprolol. He denies any chest pain, shortness of breath, or palpitations. He does have some leg edema but stable.  Long history of difficult to control hypertension, however he does not know if he's ever been evaluated for secondary hypertension.  PMH: Smoking status noted ROS: Per HPI  Objective: BP (!) 144/80   Pulse 63   Temp 97.3 F (36.3 C) (Oral)   Ht 5\' 11"  (1.803 m)   Wt (!) 339 lb 9.6 oz (154 kg)   BMI 47.36 kg/m  Gen: NAD, alert, cooperative with exam HEENT: NCAT, EOMI, PERRL CV: RRR, good S1/S2, no murmur Resp: CTABL, no wheezes, non-labored Abd: SNTND, BS present, no guarding or organomegaly Ext: No edema, warm Neuro: Alert and oriented, No gross deficits  Assessment and plan:  # HTN Difficult to control Improved slightly since last visit Increased beta blocker, consider adding potassium sparing diuretic if not significantly changed, consider Conn syndrome/primary hyperaldosteronism, low normal potassium.  Repeat BMP today to trend potassium    Meds ordered this encounter  Medications  . metoprolol succinate (TOPROL-XL) 100 MG 24 hr tablet    Sig: Take 1 tablet (100 mg total) by mouth daily. Take with or immediately following a meal.    Dispense:  30 tablet    Refill:  5    Murtis SinkSam Tiernan Suto, MD [facility] 01/06/2016, 3:49 PM

## 2016-01-07 LAB — BASIC METABOLIC PANEL
BUN / CREAT RATIO: 19 (ref 10–24)
BUN: 15 mg/dL (ref 8–27)
CALCIUM: 9.8 mg/dL (ref 8.6–10.2)
CHLORIDE: 97 mmol/L (ref 96–106)
CO2: 25 mmol/L (ref 18–29)
Creatinine, Ser: 0.81 mg/dL (ref 0.76–1.27)
GFR calc Af Amer: 110 mL/min/{1.73_m2} (ref >59)
GFR calc non Af Amer: 95 mL/min/{1.73_m2} (ref >59)
GLUCOSE: 128 mg/dL — AB (ref 65–99)
Potassium: 4.1 mmol/L (ref 3.5–5.2)
Sodium: 140 mmol/L (ref 134–144)

## 2016-01-07 LAB — LDL CHOLESTEROL, DIRECT: LDL DIRECT: 182 mg/dL — AB (ref 0–99)

## 2016-02-13 ENCOUNTER — Ambulatory Visit (INDEPENDENT_AMBULATORY_CARE_PROVIDER_SITE_OTHER): Payer: BLUE CROSS/BLUE SHIELD | Admitting: Family Medicine

## 2016-02-13 ENCOUNTER — Encounter: Payer: Self-pay | Admitting: Family Medicine

## 2016-02-13 VITALS — BP 146/84 | HR 59 | Temp 97.8°F | Ht 71.0 in | Wt 338.2 lb

## 2016-02-13 DIAGNOSIS — I10 Essential (primary) hypertension: Secondary | ICD-10-CM | POA: Diagnosis not present

## 2016-02-13 DIAGNOSIS — E785 Hyperlipidemia, unspecified: Secondary | ICD-10-CM | POA: Diagnosis not present

## 2016-02-13 MED ORDER — METOPROLOL SUCCINATE ER 100 MG PO TB24: 100.0000 mg | ORAL_TABLET | Freq: Every day | ORAL | 3 refills | Status: DC

## 2016-02-13 NOTE — Progress Notes (Signed)
   HPI  Patient presents today for follow-up for hypertension.  Patient reports good medication compliance and states that he feels his blood pressure is doing much better. On average she is in the 130s and 140s systolic, he states this is the best control he's had throughout his entire life. He reports that one time previously when working closely with one of our clinical pharmacist he had blood pressure similar to this. He denies any chest pain, dyspnea, palpitations, or worsening leg swelling.   PMH: Smoking status noted ROS: Per HPI  Objective: BP (!) 146/84   Pulse (!) 59   Temp 97.8 F (36.6 C) (Oral)   Ht 5\' 11"  (1.803 m)   Wt (!) 338 lb 3.2 oz (153.4 kg)   BMI 47.17 kg/m  Gen: NAD, alert, cooperative with exam HEENT: NCAT CV: RRR, good S1/S2, no murmur Resp: CTABL, no wheezes, non-labored Ext: No edema, warm Neuro: Alert and oriented, No gross deficits  Assessment and plan:  # Hypertension Improving, slightly above goal Continue clonidine, lisinopril ([facility] , amlodipine, metoprolol Consider Aldactone or increasing clonidine if additional medications are needed.   # HLD Very elevated LDL of 182 Patient will be discussing this with [facility], I have recommended a statin He has failed simvastatin, and he states another one which he cannot remember the name of He had severe myalgias with these. Consider livalo No Hx of ASCVD, will discuss with his [facility] doctor this week at follow up appt.    Meds ordered this encounter  Medications  . metoprolol succinate (TOPROL-XL) 100 MG 24 hr tablet    Sig: Take 1 tablet (100 mg total) by mouth daily. Take with or immediately following a meal.    Dispense:  90 tablet    Refill:  3    Murtis SinkSam Bradshaw, MD [facility] 02/13/2016, 3:35 PM

## 2017-05-31 ENCOUNTER — Ambulatory Visit: Payer: BLUE CROSS/BLUE SHIELD | Admitting: Family Medicine

## 2017-05-31 NOTE — Progress Notes (Deleted)
Subjective: CC: legs hurt PCP: Elenora Gamma, MD Joe Vance is a 67 y.o. male presenting to clinic today for:  1. LE pain ***   ROS: Per HPI  Allergies  Allergen Reactions  . Allopurinol   . Keflex [Cephalexin] Nausea And Vomiting  . Sulfa Drugs Cross Reactors    Past Medical History:  Diagnosis Date  . BPH (benign prostatic hyperplasia)   . GERD (gastroesophageal reflux disease)   . HTN (hypertension)   . Hyperlipidemia   . Migraines   . Morbid obesity (HCC)   . Osteoarthritis   . Sleep apnea     Current Outpatient Medications:  .  acyclovir (ZOVIRAX) 800 MG tablet, Take 800 mg by mouth 5 (five) times daily., Disp: , Rfl:  .  amitriptyline (ELAVIL) 50 MG tablet, Take 50 mg by mouth at bedtime., Disp: , Rfl:  .  amLODipine (NORVASC) 10 MG tablet, , Disp: , Rfl:  .  aspirin 81 MG tablet, Take 81 mg by mouth daily., Disp: , Rfl:  .  butalbital-acetaminophen-caffeine (FIORICET, ESGIC) 50-325-40 MG per tablet, , Disp: , Rfl:  .  cloNIDine (CATAPRES) 0.1 MG tablet, , Disp: , Rfl:  .  DEPO-TESTOSTERONE 200 MG/ML injection, , Disp: , Rfl:  .  febuxostat (ULORIC) 40 MG tablet, Take 40 mg by mouth daily., Disp: , Rfl:  .  ferrous sulfate 325 (65 FE) MG tablet, Take 325 mg by mouth daily with breakfast., Disp: , Rfl:  .  hydrochlorothiazide (HYDRODIURIL) 25 MG tablet, Take 25 mg by mouth daily., Disp: , Rfl:  .  HYDROcodone-acetaminophen (NORCO) 5-325 MG per tablet, Take 1 tablet by mouth every 6 (six) hours as needed., Disp: , Rfl:  .  meloxicam (MOBIC) 15 MG tablet, Take 15 mg by mouth daily., Disp: , Rfl:  .  metoprolol succinate (TOPROL-XL) 100 MG 24 hr tablet, Take 1 tablet (100 mg total) by mouth daily. Take with or immediately following a meal., Disp: 90 tablet, Rfl: 3 .  Multiple Vitamin (MULTIVITAMIN) tablet, Take 1 tablet by mouth daily., Disp: , Rfl:  .  Omega-3 Fatty Acids (FISH OIL) 1000 MG CAPS, Take by mouth., Disp: , Rfl:  .  pantoprazole (PROTONIX) 40  MG tablet, Take 40 mg by mouth 2 (two) times daily., Disp: , Rfl:  .  potassium chloride SA (K-DUR,KLOR-CON) 20 MEQ tablet, Take 20 mEq by mouth 2 (two) times daily., Disp: , Rfl:  .  Pseudoephedrine-DM (FORMULA D PO), Take by mouth., Disp: , Rfl:  .  vardenafil (LEVITRA) 20 MG tablet, Take 20 mg by mouth daily as needed., Disp: , Rfl:  .  Vitamin D, Ergocalciferol, (DRISDOL) 50000 UNITS CAPS capsule, Take 50,000 Units by mouth every 7 (seven) days., Disp: , Rfl:  Social History   Socioeconomic History  . Marital status: Unknown    Spouse name: Not on file  . Number of children: Not on file  . Years of education: Not on file  . Highest education level: Not on file  Social Needs  . Financial resource strain: Not on file  . Food insecurity - worry: Not on file  . Food insecurity - inability: Not on file  . Transportation needs - medical: Not on file  . Transportation needs - non-medical: Not on file  Occupational History  . Occupation: Runner, broadcasting/film/video  Tobacco Use  . Smoking status: Never Smoker  . Smokeless tobacco: Never Used  Substance and Sexual Activity  . Alcohol use: Yes    Alcohol/week: 8.4 oz  Types: 14 Shots of liquor per week  . Drug use: No  . Sexual activity: Not on file  Other Topics Concern  . 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   Family History  Problem Relation Age of Onset  . Coronary artery disease Father [id number]  . Stroke Mother [id number]       Died age 65  . Hypertension Mother     Objective: Office vital signs reviewed. There were no vitals taken for this visit.  Physical Examination:  General: Awake, alert, *** nourished, No acute distress HEENT: Normal    Neck: No masses palpated. No lymphadenopathy    Ears: Tympanic membranes intact, normal light reflex, no erythema, no bulging    Eyes: PERRLA, extraocular membranes intact, sclera ***    Nose: nasal turbinates moist, *** nasal discharge    Throat: moist mucus membranes, no erythema,  *** tonsillar exudate.  Airway is patent Cardio: regular rate and rhythm, S1S2 heard, no murmurs appreciated Pulm: clear to auscultation bilaterally, no wheezes, rhonchi or rales; normal work of breathing on room air GI: soft, non-tender, non-distended, bowel sounds present x4, no hepatomegaly, no splenomegaly, no masses GU: external vaginal tissue ***, cervix ***, *** punctate lesions on cervix appreciated, *** discharge from cervical os, *** bleeding, *** cervical motion tenderness, *** abdominal/ adnexal masses Extremities: warm, well perfused, No edema, cyanosis or clubbing; +*** pulses bilaterally MSK: *** gait and *** station Skin: dry; intact; no rashes or lesions Neuro: *** Strength and light touch sensation grossly intact, *** DTRs ***/4  Assessment/ Plan: 67 y.o. male   ***  No orders of the defined types were placed in this encounter.  No orders of the defined types were placed in this encounter.    Raliegh IpAshly M Gottschalk, DO [facility] [phone]

## 2017-07-23 ENCOUNTER — Ambulatory Visit: Payer: BLUE CROSS/BLUE SHIELD | Admitting: Family Medicine

## 2017-07-23 ENCOUNTER — Encounter: Payer: Self-pay | Admitting: Family Medicine

## 2017-07-23 VITALS — BP 143/82 | HR 64 | Temp 96.9°F | Ht 71.0 in | Wt 342.8 lb

## 2017-07-23 DIAGNOSIS — M791 Myalgia, unspecified site: Secondary | ICD-10-CM | POA: Insufficient documentation

## 2017-07-23 DIAGNOSIS — H6123 Impacted cerumen, bilateral: Secondary | ICD-10-CM

## 2017-07-23 DIAGNOSIS — M064 Inflammatory polyarthropathy: Secondary | ICD-10-CM | POA: Insufficient documentation

## 2017-07-23 DIAGNOSIS — M545 Low back pain, unspecified: Secondary | ICD-10-CM | POA: Insufficient documentation

## 2017-07-23 MED ORDER — AMOXICILLIN 500 MG PO CAPS: 500.0000 mg | ORAL_CAPSULE | Freq: Three times a day (TID) | ORAL | 0 refills | Status: DC

## 2017-07-23 NOTE — Progress Notes (Signed)
   HPI  Patient presents today here with ear pain.  Patient explains he has had 4-5 days of left ear pain.  He states that one day ago began to have muffled hearing.  Patient denies any he has had some intermittent nasal congestion. No shortness of breath, fever, chills, sweats.  Previously had similar symptoms a few years ago with unsuccessful irrigation, he was seen in ENT with good cleanout at that time.    PMH: Smoking status noted ROS: Per HPI  Objective: BP (!) 143/82   Pulse 64   Temp (!) 96.9 F (36.1 C) (Oral)   Ht 5\' 11"  (1.803 m)   Wt (!) 342 lb 12.8 oz (155.5 kg)   BMI 47.81 kg/m  Gen: NAD, alert, cooperative with exam HEENT: NCAT, TMs obscured bilaterally by hard dry cerumen CV: RRR, good S1/S2, no murmur Resp: CTABL, no wheezes, non-labored Ext: No edema, warm Neuro: Alert and oriented, No gross deficits  Assessment and plan:  #Bilateral cerumen impaction L>R, irrigation and careful cerumen removal with a curette on the left performed today with no improvement.  Irrigation was performed bilaterally by nursing without good clearing of the cerumen.  I have gone ahead and sent amoxicillin to cover for otitis media as his left TM is not visible and he does have discomfort that has been persistent.  Keflex previously with nausea and vomiting, we discussed that this is hopefully not going to happen with amoxicillin.    Orders Placed This Encounter  Procedures  . Ambulatory referral to ENT    Referral Priority:   Routine    Referral Type:   Consultation    Referral Reason:   Specialty Services Required    Requested Specialty:   Otolaryngology    Number of Visits Requested:   1     Murtis SinkSam Bradshaw, MD [facility] 07/23/2017, 4:55 PM

## 2017-08-09 ENCOUNTER — Ambulatory Visit: Payer: BLUE CROSS/BLUE SHIELD | Admitting: Family Medicine

## 2017-08-22 ENCOUNTER — Ambulatory Visit (INDEPENDENT_AMBULATORY_CARE_PROVIDER_SITE_OTHER): Payer: BLUE CROSS/BLUE SHIELD | Admitting: Otolaryngology

## 2017-08-22 DIAGNOSIS — H6123 Impacted cerumen, bilateral: Secondary | ICD-10-CM | POA: Diagnosis not present

## 2017-08-22 DIAGNOSIS — H900 Conductive hearing loss, bilateral: Secondary | ICD-10-CM

## 2017-10-22 DIAGNOSIS — G4733 Obstructive sleep apnea (adult) (pediatric): Secondary | ICD-10-CM | POA: Diagnosis not present

## 2017-10-22 DIAGNOSIS — Z9989 Dependence on other enabling machines and devices: Secondary | ICD-10-CM | POA: Diagnosis not present

## 2017-10-23 DIAGNOSIS — G4733 Obstructive sleep apnea (adult) (pediatric): Secondary | ICD-10-CM | POA: Diagnosis not present

## 2017-10-30 DIAGNOSIS — G4733 Obstructive sleep apnea (adult) (pediatric): Secondary | ICD-10-CM | POA: Diagnosis not present

## 2017-11-06 DIAGNOSIS — K760 Fatty (change of) liver, not elsewhere classified: Secondary | ICD-10-CM | POA: Diagnosis not present

## 2017-11-06 DIAGNOSIS — K219 Gastro-esophageal reflux disease without esophagitis: Secondary | ICD-10-CM | POA: Diagnosis not present

## 2017-11-06 DIAGNOSIS — K269 Duodenal ulcer, unspecified as acute or chronic, without hemorrhage or perforation: Secondary | ICD-10-CM | POA: Diagnosis not present

## 2017-11-06 DIAGNOSIS — D500 Iron deficiency anemia secondary to blood loss (chronic): Secondary | ICD-10-CM | POA: Diagnosis not present

## 2017-11-12 DIAGNOSIS — K760 Fatty (change of) liver, not elsewhere classified: Secondary | ICD-10-CM | POA: Diagnosis not present

## 2018-01-03 DIAGNOSIS — M19011 Primary osteoarthritis, right shoulder: Secondary | ICD-10-CM | POA: Diagnosis not present

## 2018-01-03 DIAGNOSIS — M1712 Unilateral primary osteoarthritis, left knee: Secondary | ICD-10-CM | POA: Diagnosis not present

## 2018-02-04 DIAGNOSIS — M25511 Pain in right shoulder: Secondary | ICD-10-CM | POA: Diagnosis not present

## 2018-02-08 DIAGNOSIS — M25511 Pain in right shoulder: Secondary | ICD-10-CM | POA: Diagnosis not present

## 2018-02-18 DIAGNOSIS — M25511 Pain in right shoulder: Secondary | ICD-10-CM | POA: Diagnosis not present

## 2018-03-25 NOTE — Pre-Procedure Instructions (Signed)
Joe Vance  03/25/2018      [id number] [place] [place] - [address] [place] [address] Phone: [phone] Fax: [phone]    Your procedure is scheduled on Friday, November 8th.  Report to [facility] Admitting at 5:30 A.M.  Call this number if you have problems the morning of surgery:  [phone]   Remember:  Do not eat or drink after midnight.    Take these medicines the morning of surgery with A SIP OF WATER  acyclovir (ZOVIRAX) amitriptyline (ELAVIL)  amLODipine (NORVASC)  cloNIDine (CATAPRES) febuxostat (ULORIC) metoprolol tartrate (LOPRESSOR)  pantoprazole (PROTONIX)   As needed: fluticasone (FLONASE) HYDROcodone-acetaminophen (NORCO)  Follow your surgeon's instructions on when to stop Asprin.  If no instructions were given by your surgeon then you will need to call the office to get those instructions.    7 days prior to surgery STOP taking any meloxicam (MOBIC), butalbital-acetaminophen-caffeine (FIORICET), Aleve, Naproxen, Ibuprofen, Motrin, Advil, Goody's, BC's, all herbal medications, fish oil, and all vitamins.    Do not wear jewelry.  Do not wear lotions, powders, or colognes, or deodorant.  Men may shave face and neck.  Do not bring valuables to the hospital.  [facility] is not responsible for any belongings or valuables.  Contacts, dentures or bridgework may not be worn into surgery.  Leave your suitcase in the car.  After surgery it may be brought to your room.  For patients admitted to the hospital, discharge time will be determined by your treatment team.  Patients discharged the day of surgery will not be allowed to drive home.   Special instructions:   [facility]- Preparing For Surgery  Before surgery, you can play an important role. Because skin is not sterile, your skin needs to be as free of germs as possible. You can reduce the number of germs on your skin by washing with CHG  (chlorahexidine gluconate) Soap before surgery.  CHG is an antiseptic cleaner which kills germs and bonds with the skin to continue killing germs even after washing.    Oral Hygiene is also important to reduce your risk of infection.  Remember - BRUSH YOUR TEETH THE MORNING OF SURGERY WITH YOUR REGULAR TOOTHPASTE  Please do not use if you have an allergy to CHG or antibacterial soaps. If your skin becomes reddened/irritated stop using the CHG.  Do not shave (including legs and underarms) for at least 48 hours prior to first CHG shower. It is OK to shave your face.  Please follow these instructions carefully.   1. Shower the NIGHT BEFORE SURGERY and the MORNING OF SURGERY with CHG.   2. If you chose to wash your hair, wash your hair first as usual with your normal shampoo.  3. After you shampoo, rinse your hair and body thoroughly to remove the shampoo.  4. Use CHG as you would any other liquid soap. You can apply CHG directly to the skin and wash gently with a scrungie or a clean washcloth.   5. Apply the CHG Soap to your body ONLY FROM THE NECK DOWN.  Do not use on open wounds or open sores. Avoid contact with your eyes, ears, mouth and genitals (private parts). Wash Face and genitals (private parts)  with your normal soap.  6. Wash thoroughly, paying special attention to the area where your surgery will be performed.  7. Thoroughly rinse your body with warm water from the neck down.  8.  DO NOT shower/wash with your normal soap after using and rinsing off the CHG Soap.  9. Pat yourself dry with a CLEAN TOWEL.  10. Wear CLEAN PAJAMAS to bed the night before surgery, wear comfortable clothes the morning of surgery  11. Place CLEAN SHEETS on your bed the night of your first shower and DO NOT SLEEP WITH PETS.    Day of Surgery:  Do not apply any deodorants/lotions.  Please wear clean clothes to the hospital/surgery center.   Remember to brush your teeth WITH YOUR REGULAR  TOOTHPASTE.   Please read over the following fact sheets that you were given.

## 2018-03-25 NOTE — H&P (Signed)
Patient's anticipated LOS is less than 2 midnights, meeting these requirements: - Younger than 73 - Lives within 1 hour of care - Has a competent adult at home to recover with post-op recover - NO history of  - Chronic pain requiring opiods  - Diabetes  - Coronary Artery Disease  - Heart failure  - Heart attack  - Stroke  - DVT/VTE  - Cardiac arrhythmia  - Respiratory Failure/COPD  - Renal failure  - Anemia  - Advanced Liver disease       Joe Vance is an 67 y.o. male.    Chief Complaint: right shoulder pain  HPI: Joe Vance is a 67 y.o. male complaining of right shoulder pain for multiple years. Pain had continually increased since the beginning. X-rays in the clinic show end-stage arthritic changes of the right shoulder. Joe Vance has tried various conservative treatments which have failed to alleviate their symptoms, including injections and therapy. Various options are discussed with the patient. Risks, benefits and expectations were discussed with the patient. Patient understand the risks, benefits and expectations and wishes to proceed with surgery.   PCP:  Elenora Gamma, MD  D/C Plans: Home  PMH: Past Medical History:  Diagnosis Date  . BPH (benign prostatic hyperplasia)   . GERD (gastroesophageal reflux disease)   . HTN (hypertension)   . Hyperlipidemia   . Migraines   . Morbid obesity (HCC)   . Osteoarthritis   . Sleep apnea     PSH: Past Surgical History:  Procedure Laterality Date  . CHOLECYSTECTOMY    . INGUINAL HERNIA REPAIR     x 2  . KNEE SURGERY     x 2    Social History:  reports that he has never smoked. He has never used smokeless tobacco. He reports that he drinks about 14.0 standard drinks of alcohol per week. He reports that he does not use drugs.  Allergies:  Allergies  Allergen Reactions  . Allopurinol     rash  . Keflex [Cephalexin] Nausea And Vomiting  . Rosuvastatin Other (See Comments)  . Simvastatin Other (See Comments)  . Sulfa  Antibiotics Hives  . Sulfa Drugs Cross Reactors     Medications: No current facility-administered medications for this encounter.    Current Outpatient Medications  Medication Sig Dispense Refill  . acyclovir (ZOVIRAX) 800 MG tablet Take 400 mg by mouth 2 (two) times daily.     Marland Kitchen amitriptyline (ELAVIL) 50 MG tablet Take 50 mg by mouth 2 (two) times daily.     Marland Kitchen amLODipine (NORVASC) 10 MG tablet Take 10 mg by mouth daily.     Marland Kitchen aspirin 81 MG tablet Take 81 mg by mouth daily.    . butalbital-acetaminophen-caffeine (FIORICET, ESGIC) 50-325-40 MG per tablet Take 1 tablet by mouth every 6 (six) hours as needed for headache.     . cloNIDine (CATAPRES) 0.1 MG tablet Take 0.1 mg by mouth 2 (two) times daily.     Marland Kitchen DEPO-TESTOSTERONE 200 MG/ML injection Inject 200 mg into the muscle every 28 (twenty-eight) days.     . diphenhydrAMINE (BENADRYL) 25 MG tablet Take 75 mg by mouth at bedtime.    . febuxostat (ULORIC) 40 MG tablet Take 40 mg by mouth daily.    . ferrous sulfate 325 (65 FE) MG tablet Take 325 mg by mouth 3 (three) times a week.     . fluocinonide ointment (LIDEX) 0.05 % Apply 1 application topically 2 (two) times daily as needed (skin irritation).    Marland Kitchen  fluticasone (FLONASE) 50 MCG/ACT nasal spray Place 1 spray into both nostrils at bedtime.    . hydrochlorothiazide (HYDRODIURIL) 25 MG tablet Take 25 mg by mouth daily.    Marland Kitchen HYDROcodone-acetaminophen (NORCO) 5-325 MG per tablet Take 1 tablet by mouth every 6 (six) hours as needed (pain).     Marland Kitchen lisinopril (PRINIVIL,ZESTRIL) 40 MG tablet Take 40 mg by mouth 2 (two) times daily.    . meloxicam (MOBIC) 15 MG tablet Take 15 mg by mouth daily.    . metoprolol tartrate (LOPRESSOR) 50 MG tablet Take 50 mg by mouth 2 (two) times daily.    . Multiple Vitamin (MULTIVITAMIN) tablet Take 1 tablet by mouth daily.    . Omega-3 Fatty Acids (FISH OIL PO) Take 1 capsule by mouth daily. 2400 mg + DHA 630 mg    . pantoprazole (PROTONIX) 40 MG tablet Take 40  mg by mouth daily.     . potassium chloride SA (K-DUR,KLOR-CON) 20 MEQ tablet Take 10 mEq by mouth 3 (three) times a week.     . Vitamin D, Ergocalciferol, (DRISDOL) 50000 UNITS CAPS capsule Take 50,000 Units by mouth 2 (two) times a week.       No results found for this or any previous visit (from the past 48 hour(s)). No results found.  ROS: Pain with rom of the right upper extremity  Physical Exam: Alert and oriented 67 y.o. male in no acute distress Cranial nerves 2-12 intact Cervical spine: full rom with no tenderness, nv intact distally Chest: active breath sounds bilaterally, no wheeze rhonchi or rales Heart: regular rate and rhythm, no murmur Abd: non tender non distended with active bowel sounds Hip is stable with rom  Right shoulder with painful rom nv intact distally No rashes or edema  Pain with rom  Assessment/Plan Assessment: right shoulder end stage osteoarthritis  Plan:  Patient will undergo a right total shoulder arthroplasty by Dr. Ranell Patrick at [facility]. Risks benefits and expectations were discussed with the patient. Patient understand risks, benefits and expectations and wishes to proceed. Preoperative templating of the joint replacement has been completed, documented, and submitted to the Operating Room personnel in order to optimize intra-operative equipment management.   Alphonsa Overall PA-C, MPAS [facility] is now [employer] [address], [address], [place], [place] [address] Phone: [phone] [email or web address] Facebook  [employer]

## 2018-03-26 ENCOUNTER — Encounter (HOSPITAL_COMMUNITY)
Admission: RE | Admit: 2018-03-26 | Discharge: 2018-03-26 | Disposition: A | Discharge status: Home / Self Care | Payer: No Typology Code available for payment source | Source: Ambulatory Visit | Attending: Orthopedic Surgery | Admitting: Orthopedic Surgery

## 2018-03-26 ENCOUNTER — Encounter (HOSPITAL_COMMUNITY): Payer: Self-pay

## 2018-03-26 ENCOUNTER — Other Ambulatory Visit: Payer: Self-pay

## 2018-03-26 DIAGNOSIS — M19011 Primary osteoarthritis, right shoulder: Secondary | ICD-10-CM | POA: Diagnosis not present

## 2018-03-26 DIAGNOSIS — Z01818 Encounter for other preprocedural examination: Secondary | ICD-10-CM | POA: Diagnosis not present

## 2018-03-26 DIAGNOSIS — I447 Left bundle-branch block, unspecified: Secondary | ICD-10-CM | POA: Insufficient documentation

## 2018-03-26 HISTORY — DX: Nausea with vomiting, unspecified: Z98.890

## 2018-03-26 HISTORY — DX: Other complications of anesthesia, initial encounter: T88.59XA

## 2018-03-26 HISTORY — DX: Primary osteoarthritis, unspecified shoulder: M19.019

## 2018-03-26 HISTORY — DX: Adverse effect of unspecified anesthetic, initial encounter: T41.45XA

## 2018-03-26 HISTORY — DX: Nausea with vomiting, unspecified: R11.2

## 2018-03-26 LAB — BASIC METABOLIC PANEL
ANION GAP: 7 (ref 5–15)
BUN: 14 mg/dL (ref 8–23)
CHLORIDE: 106 mmol/L (ref 98–111)
CO2: 25 mmol/L (ref 22–32)
Calcium: 9.6 mg/dL (ref 8.9–10.3)
Creatinine, Ser: 0.81 mg/dL (ref 0.61–1.24)
GFR calc Af Amer: >60 mL/min (ref >60)
GFR calc non Af Amer: >60 mL/min (ref >60)
GLUCOSE: 93 mg/dL (ref 70–99)
Potassium: 3.7 mmol/L (ref 3.5–5.1)
Sodium: 138 mmol/L (ref 135–145)

## 2018-03-26 LAB — CBC
HEMATOCRIT: 46.1 % (ref 39.0–52.0)
HEMOGLOBIN: 15.7 g/dL (ref 13.0–17.0)
MCH: 31.8 pg (ref 26.0–34.0)
MCHC: 34.1 g/dL (ref 30.0–36.0)
MCV: 93.3 fL (ref 80.0–100.0)
Platelets: 287 10*3/uL (ref 150–400)
RBC: 4.94 MIL/uL (ref 4.22–5.81)
RDW: 12.5 % (ref 11.5–15.5)
WBC: 8.3 10*3/uL (ref 4.0–10.5)
nRBC: 0 % (ref 0.0–0.2)

## 2018-03-26 LAB — SURGICAL PCR SCREEN
MRSA, PCR: NEGATIVE
Staphylococcus aureus: POSITIVE — AB

## 2018-03-26 NOTE — Progress Notes (Signed)
PCP - Dr. Dutch Quint- [facility]  Cardiologist - Denies  Chest x-ray - Denies  EKG - 03/26/18  Stress Test - 08/28/11 (E)  ECHO - Denies  Cardiac Cath - Denies  AICD- na PM- na LOOP- na  Sleep Study - Yes- Positive CPAP - Yes- told to bring mask dos  LABS- 03/26/18: CBC, BMP, PCR  ASA- LD- 03/29/18   Anesthesia- No  Pt denies having chest pain, sob, or fever at this time. All instructions explained to the pt, with a verbal understanding of the material. Pt agrees to go over the instructions while at home for a better understanding. The opportunity to ask questions was provided.

## 2018-03-26 NOTE — Progress Notes (Signed)
Surgical PCR +MSSA. [facility]up[phone]. Patient notified. Verbalized understanding.

## 2018-04-04 ENCOUNTER — Inpatient Hospital Stay (HOSPITAL_COMMUNITY): Payer: No Typology Code available for payment source | Admitting: Vascular Surgery

## 2018-04-04 ENCOUNTER — Encounter (HOSPITAL_COMMUNITY)
Admission: RE | Disposition: A | Discharge status: Home / Self Care | Payer: Self-pay | Source: Home / Self Care | Attending: Orthopedic Surgery

## 2018-04-04 ENCOUNTER — Inpatient Hospital Stay (HOSPITAL_COMMUNITY)
Admission: RE | Admit: 2018-04-04 | Discharge: 2018-04-06 | DRG: 483 | Disposition: A | Discharge status: Home / Self Care | Payer: No Typology Code available for payment source | Attending: Orthopedic Surgery | Admitting: Orthopedic Surgery

## 2018-04-04 ENCOUNTER — Inpatient Hospital Stay (HOSPITAL_COMMUNITY): Payer: No Typology Code available for payment source

## 2018-04-04 DIAGNOSIS — Z6841 Body Mass Index (BMI) 40.0 and over, adult: Secondary | ICD-10-CM | POA: Diagnosis not present

## 2018-04-04 DIAGNOSIS — I10 Essential (primary) hypertension: Secondary | ICD-10-CM | POA: Diagnosis not present

## 2018-04-04 DIAGNOSIS — Z7951 Long term (current) use of inhaled steroids: Secondary | ICD-10-CM

## 2018-04-04 DIAGNOSIS — M25511 Pain in right shoulder: Secondary | ICD-10-CM | POA: Diagnosis present

## 2018-04-04 DIAGNOSIS — Z79899 Other long term (current) drug therapy: Secondary | ICD-10-CM

## 2018-04-04 DIAGNOSIS — E785 Hyperlipidemia, unspecified: Secondary | ICD-10-CM | POA: Diagnosis not present

## 2018-04-04 DIAGNOSIS — Z7982 Long term (current) use of aspirin: Secondary | ICD-10-CM

## 2018-04-04 DIAGNOSIS — K219 Gastro-esophageal reflux disease without esophagitis: Secondary | ICD-10-CM | POA: Diagnosis not present

## 2018-04-04 DIAGNOSIS — Z96611 Presence of right artificial shoulder joint: Secondary | ICD-10-CM

## 2018-04-04 DIAGNOSIS — N400 Enlarged prostate without lower urinary tract symptoms: Secondary | ICD-10-CM | POA: Diagnosis present

## 2018-04-04 DIAGNOSIS — M19011 Primary osteoarthritis, right shoulder: Secondary | ICD-10-CM | POA: Diagnosis present

## 2018-04-04 HISTORY — PX: REVERSE SHOULDER ARTHROPLASTY: SHX5054

## 2018-04-04 LAB — CBC
HCT: 34.6 % — ABNORMAL LOW (ref 39.0–52.0)
Hemoglobin: 12.5 g/dL — ABNORMAL LOW (ref 13.0–17.0)
MCH: 33.2 pg (ref 26.0–34.0)
MCHC: 36.1 g/dL — AB (ref 30.0–36.0)
MCV: 91.8 fL (ref 80.0–100.0)
PLATELETS: 250 10*3/uL (ref 150–400)
RBC: 3.77 MIL/uL — ABNORMAL LOW (ref 4.22–5.81)
RDW: 12.2 % (ref 11.5–15.5)
WBC: 12 10*3/uL — ABNORMAL HIGH (ref 4.0–10.5)
nRBC: 0 % (ref 0.0–0.2)

## 2018-04-04 LAB — POCT I-STAT 4, (NA,K, GLUC, HGB,HCT)
GLUCOSE: 109 mg/dL — AB (ref 70–99)
HCT: 35 % — ABNORMAL LOW (ref 39.0–52.0)
Hemoglobin: 11.9 g/dL — ABNORMAL LOW (ref 13.0–17.0)
Potassium: 3.8 mmol/L (ref 3.5–5.1)
SODIUM: 139 mmol/L (ref 135–145)

## 2018-04-04 LAB — APTT: aPTT: 28 seconds (ref 24–36)

## 2018-04-04 LAB — PROTIME-INR
INR: 1.05
PROTHROMBIN TIME: 13.6 s (ref 11.4–15.2)

## 2018-04-04 LAB — ABO/RH: ABO/RH(D): A NEG

## 2018-04-04 LAB — PREPARE RBC (CROSSMATCH)

## 2018-04-04 SURGERY — ARTHROPLASTY, SHOULDER, TOTAL, REVERSE
Anesthesia: General | Laterality: Right

## 2018-04-04 MED ORDER — METOPROLOL TARTRATE 50 MG PO TABS
50.0000 mg | ORAL_TABLET | Freq: Two times a day (BID) | ORAL | Status: DC
  Administered 2018-04-04 – 2018-04-06 (×4): 50 mg via ORAL
  Filled 2018-04-04 (×4): qty 1

## 2018-04-04 MED ORDER — OXYCODONE HCL 5 MG PO TABS
5.0000 mg | ORAL_TABLET | ORAL | Status: DC | PRN
  Administered 2018-04-05: 10 mg via ORAL
  Filled 2018-04-04 (×2): qty 2

## 2018-04-04 MED ORDER — LACTATED RINGERS IV SOLN
INTRAVENOUS | Status: DC
  Administered 2018-04-04 (×3): via INTRAVENOUS

## 2018-04-04 MED ORDER — LACTATED RINGERS IV SOLN
INTRAVENOUS | Status: DC | PRN
  Administered 2018-04-04: 17:00:00 via INTRAVENOUS

## 2018-04-04 MED ORDER — METHOCARBAMOL 500 MG PO TABS
500.0000 mg | ORAL_TABLET | Freq: Four times a day (QID) | ORAL | Status: DC | PRN
  Administered 2018-04-05: 500 mg via ORAL
  Filled 2018-04-04 (×2): qty 1

## 2018-04-04 MED ORDER — MIDAZOLAM HCL 2 MG/2ML IJ SOLN: 0.5000 mg | Freq: Once | INTRAMUSCULAR | Status: DC | PRN

## 2018-04-04 MED ORDER — ACYCLOVIR 400 MG PO TABS
400.0000 mg | ORAL_TABLET | Freq: Two times a day (BID) | ORAL | Status: DC
  Administered 2018-04-04 – 2018-04-06 (×4): 400 mg via ORAL
  Filled 2018-04-04 (×4): qty 1

## 2018-04-04 MED ORDER — HYDROMORPHONE HCL 1 MG/ML IJ SOLN: 0.2500 mg | INTRAMUSCULAR | Status: DC | PRN

## 2018-04-04 MED ORDER — FENTANYL CITRATE (PF) 100 MCG/2ML IJ SOLN
INTRAMUSCULAR | Status: DC | PRN
  Administered 2018-04-04 (×3): 50 ug via INTRAVENOUS

## 2018-04-04 MED ORDER — CLINDAMYCIN PHOSPHATE 900 MG/50ML IV SOLN
900.0000 mg | INTRAVENOUS | Status: AC
  Administered 2018-04-04: 900 mg via INTRAVENOUS
  Filled 2018-04-04: qty 50

## 2018-04-04 MED ORDER — ALBUMIN HUMAN 5 % IV SOLN
INTRAVENOUS | Status: DC | PRN
  Administered 2018-04-04 (×3): via INTRAVENOUS

## 2018-04-04 MED ORDER — SODIUM CHLORIDE 0.9 % IV SOLN
INTRAVENOUS | Status: DC | PRN
  Administered 2018-04-04: 20 ug/min via INTRAVENOUS
  Administered 2018-04-04: 16:00:00 via INTRAVENOUS

## 2018-04-04 MED ORDER — MENTHOL 3 MG MT LOZG: 1.0000 | LOZENGE | OROMUCOSAL | Status: DC | PRN

## 2018-04-04 MED ORDER — SODIUM CHLORIDE 0.9% IV SOLUTION: Freq: Once | INTRAVENOUS | Status: DC

## 2018-04-04 MED ORDER — PROMETHAZINE HCL 25 MG/ML IJ SOLN
INTRAMUSCULAR | Status: AC
  Filled 2018-04-04: qty 1

## 2018-04-04 MED ORDER — CLONIDINE HCL 0.1 MG PO TABS
0.1000 mg | ORAL_TABLET | Freq: Two times a day (BID) | ORAL | Status: DC
  Administered 2018-04-04 – 2018-04-06 (×4): 0.1 mg via ORAL
  Filled 2018-04-04 (×4): qty 1

## 2018-04-04 MED ORDER — MEPERIDINE HCL 50 MG/ML IJ SOLN: 6.2500 mg | INTRAMUSCULAR | Status: DC | PRN

## 2018-04-04 MED ORDER — EPHEDRINE SULFATE-NACL 50-0.9 MG/10ML-% IV SOSY
PREFILLED_SYRINGE | INTRAVENOUS | Status: DC | PRN
  Administered 2018-04-04 (×2): 15 mg via INTRAVENOUS
  Administered 2018-04-04: 10 mg via INTRAVENOUS

## 2018-04-04 MED ORDER — DIPHENHYDRAMINE HCL 25 MG PO TABS
75.0000 mg | ORAL_TABLET | Freq: Every day | ORAL | Status: DC
  Filled 2018-04-04: qty 3

## 2018-04-04 MED ORDER — ROCURONIUM BROMIDE 50 MG/5ML IV SOSY
PREFILLED_SYRINGE | INTRAVENOUS | Status: AC
  Filled 2018-04-04: qty 10

## 2018-04-04 MED ORDER — METOCLOPRAMIDE HCL 5 MG/ML IJ SOLN: 5.0000 mg | Freq: Three times a day (TID) | INTRAMUSCULAR | Status: DC | PRN

## 2018-04-04 MED ORDER — MIDAZOLAM HCL 2 MG/2ML IJ SOLN
INTRAMUSCULAR | Status: AC
  Administered 2018-04-04: 2 mg
  Filled 2018-04-04: qty 2

## 2018-04-04 MED ORDER — PROMETHAZINE HCL 25 MG/ML IJ SOLN
6.2500 mg | INTRAMUSCULAR | Status: AC | PRN
  Administered 2018-04-04 (×2): 6.25 mg via INTRAVENOUS

## 2018-04-04 MED ORDER — DOCUSATE SODIUM 100 MG PO CAPS
100.0000 mg | ORAL_CAPSULE | Freq: Two times a day (BID) | ORAL | Status: DC
  Administered 2018-04-04 – 2018-04-06 (×4): 100 mg via ORAL
  Filled 2018-04-04 (×4): qty 1

## 2018-04-04 MED ORDER — SODIUM CHLORIDE 0.9 % IV SOLN: INTRAVENOUS | Status: DC

## 2018-04-04 MED ORDER — HYDROMORPHONE HCL 1 MG/ML IJ SOLN: 0.5000 mg | INTRAMUSCULAR | Status: DC | PRN

## 2018-04-04 MED ORDER — ONDANSETRON HCL 4 MG/2ML IJ SOLN: 4.0000 mg | Freq: Four times a day (QID) | INTRAMUSCULAR | Status: DC | PRN

## 2018-04-04 MED ORDER — METHOCARBAMOL 1000 MG/10ML IJ SOLN: 500.0000 mg | Freq: Four times a day (QID) | INTRAVENOUS | Status: DC | PRN

## 2018-04-04 MED ORDER — DEXAMETHASONE SODIUM PHOSPHATE 10 MG/ML IJ SOLN
INTRAMUSCULAR | Status: AC
  Filled 2018-04-04: qty 1

## 2018-04-04 MED ORDER — OXYCODONE-ACETAMINOPHEN 7.5-325 MG PO TABS: 1.0000 | ORAL_TABLET | ORAL | 0 refills | Status: AC | PRN

## 2018-04-04 MED ORDER — ONDANSETRON HCL 4 MG PO TABS
4.0000 mg | ORAL_TABLET | Freq: Four times a day (QID) | ORAL | Status: DC | PRN
  Administered 2018-04-06: 4 mg via ORAL
  Filled 2018-04-04: qty 1

## 2018-04-04 MED ORDER — PROPOFOL 10 MG/ML IV BOLUS
INTRAVENOUS | Status: AC
  Filled 2018-04-04: qty 20

## 2018-04-04 MED ORDER — OXYCODONE HCL 5 MG PO TABS
10.0000 mg | ORAL_TABLET | ORAL | Status: DC | PRN
  Filled 2018-04-04: qty 2

## 2018-04-04 MED ORDER — PROPOFOL 1000 MG/100ML IV EMUL
INTRAVENOUS | Status: AC
  Filled 2018-04-04: qty 100

## 2018-04-04 MED ORDER — POLYETHYLENE GLYCOL 3350 17 G PO PACK: 17.0000 g | PACK | Freq: Every day | ORAL | Status: DC | PRN

## 2018-04-04 MED ORDER — FLUTICASONE PROPIONATE 50 MCG/ACT NA SUSP
1.0000 | Freq: Every day | NASAL | Status: DC
  Administered 2018-04-04 – 2018-04-05 (×2): 1 via NASAL
  Filled 2018-04-04: qty 16

## 2018-04-04 MED ORDER — POTASSIUM CHLORIDE CRYS ER 10 MEQ PO TBCR
10.0000 meq | EXTENDED_RELEASE_TABLET | ORAL | Status: DC
  Administered 2018-04-04: 10 meq via ORAL
  Filled 2018-04-04: qty 1

## 2018-04-04 MED ORDER — AMLODIPINE BESYLATE 10 MG PO TABS
10.0000 mg | ORAL_TABLET | Freq: Every day | ORAL | Status: DC
  Administered 2018-04-05 – 2018-04-06 (×2): 10 mg via ORAL
  Filled 2018-04-04 (×2): qty 1

## 2018-04-04 MED ORDER — SCOPOLAMINE 1 MG/3DAYS TD PT72
1.0000 | MEDICATED_PATCH | Freq: Once | TRANSDERMAL | Status: AC
  Administered 2018-04-04: 1 via TRANSDERMAL

## 2018-04-04 MED ORDER — ACETAMINOPHEN 325 MG PO TABS: 325.0000 mg | ORAL_TABLET | Freq: Four times a day (QID) | ORAL | Status: DC | PRN

## 2018-04-04 MED ORDER — AMITRIPTYLINE HCL 50 MG PO TABS
50.0000 mg | ORAL_TABLET | Freq: Two times a day (BID) | ORAL | Status: DC
  Administered 2018-04-04 – 2018-04-06 (×4): 50 mg via ORAL
  Filled 2018-04-04 (×4): qty 1

## 2018-04-04 MED ORDER — VITAMIN D (ERGOCALCIFEROL) 1.25 MG (50000 UNIT) PO CAPS: 50000.0000 [IU] | ORAL_CAPSULE | ORAL | Status: DC

## 2018-04-04 MED ORDER — ROCURONIUM BROMIDE 50 MG/5ML IV SOSY
PREFILLED_SYRINGE | INTRAVENOUS | Status: AC
  Filled 2018-04-04: qty 5

## 2018-04-04 MED ORDER — DIPHENHYDRAMINE HCL 25 MG PO CAPS
50.0000 mg | ORAL_CAPSULE | Freq: Every evening | ORAL | Status: DC | PRN
  Administered 2018-04-04: 50 mg via ORAL
  Filled 2018-04-04: qty 2

## 2018-04-04 MED ORDER — FENTANYL CITRATE (PF) 100 MCG/2ML IJ SOLN
INTRAMUSCULAR | Status: AC
  Administered 2018-04-04: 100 ug
  Filled 2018-04-04: qty 2

## 2018-04-04 MED ORDER — METOCLOPRAMIDE HCL 5 MG PO TABS: 5.0000 mg | ORAL_TABLET | Freq: Three times a day (TID) | ORAL | Status: DC | PRN

## 2018-04-04 MED ORDER — FEBUXOSTAT 40 MG PO TABS
40.0000 mg | ORAL_TABLET | Freq: Every day | ORAL | Status: DC
  Administered 2018-04-04 – 2018-04-06 (×3): 40 mg via ORAL
  Filled 2018-04-04 (×3): qty 1

## 2018-04-04 MED ORDER — BUPIVACAINE LIPOSOME 1.3 % IJ SUSP
INTRAMUSCULAR | Status: DC | PRN
  Administered 2018-04-04: 10 mL via PERINEURAL

## 2018-04-04 MED ORDER — HYDROCHLOROTHIAZIDE 25 MG PO TABS
25.0000 mg | ORAL_TABLET | Freq: Every day | ORAL | Status: DC
  Administered 2018-04-05 – 2018-04-06 (×2): 25 mg via ORAL
  Filled 2018-04-04 (×2): qty 1

## 2018-04-04 MED ORDER — PROPOFOL 10 MG/ML IV BOLUS
INTRAVENOUS | Status: DC | PRN
  Administered 2018-04-04 (×2): 50 mg via INTRAVENOUS
  Administered 2018-04-04: 200 mg via INTRAVENOUS
  Administered 2018-04-04: 50 mg via INTRAVENOUS

## 2018-04-04 MED ORDER — CEFAZOLIN SODIUM-DEXTROSE 2-4 GM/100ML-% IV SOLN
INTRAVENOUS | Status: AC
  Filled 2018-04-04: qty 100

## 2018-04-04 MED ORDER — EPHEDRINE 5 MG/ML INJ
INTRAVENOUS | Status: AC
  Filled 2018-04-04: qty 10

## 2018-04-04 MED ORDER — 0.9 % SODIUM CHLORIDE (POUR BTL) OPTIME
TOPICAL | Status: DC | PRN
  Administered 2018-04-04 (×2): 1000 mL

## 2018-04-04 MED ORDER — ASPIRIN EC 81 MG PO TBEC
81.0000 mg | DELAYED_RELEASE_TABLET | Freq: Every day | ORAL | Status: DC
  Administered 2018-04-04 – 2018-04-06 (×3): 81 mg via ORAL
  Filled 2018-04-04 (×3): qty 1

## 2018-04-04 MED ORDER — METHOCARBAMOL 500 MG PO TABS: 500.0000 mg | ORAL_TABLET | Freq: Three times a day (TID) | ORAL | 1 refills | Status: AC | PRN

## 2018-04-04 MED ORDER — HYDROCODONE-ACETAMINOPHEN 5-325 MG PO TABS
1.0000 | ORAL_TABLET | Freq: Four times a day (QID) | ORAL | Status: DC | PRN
  Administered 2018-04-05 – 2018-04-06 (×3): 1 via ORAL
  Filled 2018-04-04 (×3): qty 1

## 2018-04-04 MED ORDER — SUGAMMADEX SODIUM 200 MG/2ML IV SOLN
INTRAVENOUS | Status: DC | PRN
  Administered 2018-04-04: 200 mg via INTRAVENOUS
  Administered 2018-04-04: 300 mg via INTRAVENOUS

## 2018-04-04 MED ORDER — LIDOCAINE 2% (20 MG/ML) 5 ML SYRINGE
INTRAMUSCULAR | Status: DC | PRN
  Administered 2018-04-04: 40 mg via INTRAVENOUS

## 2018-04-04 MED ORDER — LIDOCAINE 2% (20 MG/ML) 5 ML SYRINGE
INTRAMUSCULAR | Status: AC
  Filled 2018-04-04: qty 5

## 2018-04-04 MED ORDER — ADULT MULTIVITAMIN W/MINERALS CH
1.0000 | ORAL_TABLET | Freq: Every day | ORAL | Status: DC
  Administered 2018-04-04 – 2018-04-06 (×3): 1 via ORAL
  Filled 2018-04-04 (×5): qty 1

## 2018-04-04 MED ORDER — DEXAMETHASONE SODIUM PHOSPHATE 10 MG/ML IJ SOLN
INTRAMUSCULAR | Status: DC | PRN
  Administered 2018-04-04: 5 mg via INTRAVENOUS

## 2018-04-04 MED ORDER — LISINOPRIL 40 MG PO TABS
40.0000 mg | ORAL_TABLET | Freq: Two times a day (BID) | ORAL | Status: DC
Start: 2018-04-04 — End: 2018-04-06
  Administered 2018-04-04 – 2018-04-06 (×4): 40 mg via ORAL
  Filled 2018-04-04 (×4): qty 1

## 2018-04-04 MED ORDER — ONDANSETRON HCL 4 MG/2ML IJ SOLN
INTRAMUSCULAR | Status: DC | PRN
  Administered 2018-04-04: 4 mg via INTRAVENOUS

## 2018-04-04 MED ORDER — FENTANYL CITRATE (PF) 250 MCG/5ML IJ SOLN
INTRAMUSCULAR | Status: AC
  Filled 2018-04-04: qty 5

## 2018-04-04 MED ORDER — ROCURONIUM BROMIDE 10 MG/ML (PF) SYRINGE
PREFILLED_SYRINGE | INTRAVENOUS | Status: DC | PRN
  Administered 2018-04-04 (×2): 50 mg via INTRAVENOUS

## 2018-04-04 MED ORDER — DEXMEDETOMIDINE HCL IN NACL 200 MCG/50ML IV SOLN
INTRAVENOUS | Status: AC
  Filled 2018-04-04: qty 50

## 2018-04-04 MED ORDER — CLINDAMYCIN PHOSPHATE 600 MG/50ML IV SOLN
600.0000 mg | Freq: Four times a day (QID) | INTRAVENOUS | Status: AC
  Administered 2018-04-04 – 2018-04-05 (×3): 600 mg via INTRAVENOUS
  Filled 2018-04-04 (×3): qty 50

## 2018-04-04 MED ORDER — SCOPOLAMINE 1 MG/3DAYS TD PT72
MEDICATED_PATCH | TRANSDERMAL | Status: AC
  Filled 2018-04-04: qty 1

## 2018-04-04 MED ORDER — SODIUM CHLORIDE 0.9 % IV SOLN
INTRAVENOUS | Status: DC | PRN
  Administered 2018-04-04: 17:00:00 via INTRAVENOUS

## 2018-04-04 MED ORDER — PHENOL 1.4 % MT LIQD: 1.0000 | OROMUCOSAL | Status: DC | PRN

## 2018-04-04 MED ORDER — TESTOSTERONE CYPIONATE 200 MG/ML IM SOLN: 200.0000 mg | INTRAMUSCULAR | Status: DC

## 2018-04-04 MED ORDER — PANTOPRAZOLE SODIUM 40 MG PO TBEC
40.0000 mg | DELAYED_RELEASE_TABLET | Freq: Every day | ORAL | Status: DC
  Administered 2018-04-04 – 2018-04-06 (×3): 40 mg via ORAL
  Filled 2018-04-04 (×3): qty 1

## 2018-04-04 MED ORDER — GLYCOPYRROLATE PF 0.2 MG/ML IJ SOSY
PREFILLED_SYRINGE | INTRAMUSCULAR | Status: DC | PRN
  Administered 2018-04-04: .2 mg via INTRAVENOUS

## 2018-04-04 MED ORDER — BUPIVACAINE-EPINEPHRINE (PF) 0.25% -1:200000 IJ SOLN
INTRAMUSCULAR | Status: AC
  Filled 2018-04-04: qty 30

## 2018-04-04 MED ORDER — BUPIVACAINE HCL (PF) 0.5 % IJ SOLN
INTRAMUSCULAR | Status: DC | PRN
  Administered 2018-04-04: 10 mL via PERINEURAL

## 2018-04-04 MED ORDER — BUPIVACAINE-EPINEPHRINE 0.25% -1:200000 IJ SOLN
INTRAMUSCULAR | Status: DC | PRN
  Administered 2018-04-04: 10 mL

## 2018-04-04 MED ORDER — BUTALBITAL-APAP-CAFFEINE 50-325-40 MG PO TABS
1.0000 | ORAL_TABLET | Freq: Four times a day (QID) | ORAL | Status: DC | PRN
  Administered 2018-04-05: 1 via ORAL
  Filled 2018-04-04: qty 1

## 2018-04-04 MED ORDER — SUGAMMADEX SODIUM 500 MG/5ML IV SOLN
INTRAVENOUS | Status: AC
  Filled 2018-04-04: qty 5

## 2018-04-04 MED ORDER — FERROUS SULFATE 325 (65 FE) MG PO TABS
325.0000 mg | ORAL_TABLET | ORAL | Status: DC
  Administered 2018-04-04: 325 mg via ORAL
  Filled 2018-04-04 (×2): qty 1

## 2018-04-04 MED ORDER — CHLORHEXIDINE GLUCONATE 4 % EX LIQD: 60.0000 mL | Freq: Once | CUTANEOUS | Status: DC

## 2018-04-04 MED ORDER — ONDANSETRON HCL 4 MG/2ML IJ SOLN
INTRAMUSCULAR | Status: AC
  Filled 2018-04-04: qty 2

## 2018-04-04 SURGICAL SUPPLY — 87 items
BIT DRILL 170X2.5X (BIT) IMPLANT
BIT DRILL 5/64X5 DISP (BIT) ×3 IMPLANT
BIT DRL 170X2.5X (BIT) ×1
BLADE SAW SAG 73X25 THK (BLADE) ×2
BLADE SAW SGTL 73X25 THK (BLADE) ×1 IMPLANT
CEMENT HV SMART SET (Cement) ×2 IMPLANT
CLOSURE STERI-STRIP 1/2X4 (GAUZE/BANDAGES/DRESSINGS) ×1
CLOSURE WOUND 1/2 X4 (GAUZE/BANDAGES/DRESSINGS) ×1
CLSR STERI-STRIP ANTIMIC 1/2X4 (GAUZE/BANDAGES/DRESSINGS) ×1 IMPLANT
COVER SURGICAL LIGHT HANDLE (MISCELLANEOUS) ×3 IMPLANT
COVER WAND RF STERILE (DRAPES) ×3 IMPLANT
DRAPE INCISE IOBAN 66X45 STRL (DRAPES) ×3 IMPLANT
DRAPE ORTHO SPLIT 77X108 STRL (DRAPES) ×6
DRAPE SURG ORHT 6 SPLT 77X108 (DRAPES) ×2 IMPLANT
DRAPE U-SHAPE 47X51 STRL (DRAPES) ×3 IMPLANT
DRILL 2.5 (BIT) ×3
DRSG ADAPTIC 3X8 NADH LF (GAUZE/BANDAGES/DRESSINGS) ×1 IMPLANT
DRSG PAD ABDOMINAL 8X10 ST (GAUZE/BANDAGES/DRESSINGS) ×6 IMPLANT
DURAPREP 26ML APPLICATOR (WOUND CARE) ×7 IMPLANT
ELECT BLADE 4.0 EZ CLEAN MEGAD (MISCELLANEOUS) ×3
ELECT NDL TIP 2.8 STRL (NEEDLE) ×1 IMPLANT
ELECT NEEDLE TIP 2.8 STRL (NEEDLE) ×3 IMPLANT
ELECT REM PT RETURN 9FT ADLT (ELECTROSURGICAL) ×3
ELECTRODE BLDE 4.0 EZ CLN MEGD (MISCELLANEOUS) ×1 IMPLANT
ELECTRODE REM PT RTRN 9FT ADLT (ELECTROSURGICAL) ×1 IMPLANT
EPIPHSYSI CENT SZ 2 (Orthopedic Implant) ×3 IMPLANT
EPIPHYSIS CENT SZ 2 (Orthopedic Implant) IMPLANT
GAUZE SPONGE 4X4 12PLY STRL (GAUZE/BANDAGES/DRESSINGS) ×3 IMPLANT
GLENOSPHERE XTEND RSA 42 SD +4 (Joint) ×2 IMPLANT
GLOVE BIOGEL PI ORTHO PRO 7.5 (GLOVE) ×2
GLOVE BIOGEL PI ORTHO PRO SZ8 (GLOVE) ×2
GLOVE ORTHO TXT STRL SZ7.5 (GLOVE) ×3 IMPLANT
GLOVE PI ORTHO PRO STRL 7.5 (GLOVE) ×1 IMPLANT
GLOVE PI ORTHO PRO STRL SZ8 (GLOVE) ×1 IMPLANT
GLOVE SURG ORTHO 8.5 STRL (GLOVE) ×3 IMPLANT
GOWN STRL REUS W/ TWL XL LVL3 (GOWN DISPOSABLE) ×2 IMPLANT
GOWN STRL REUS W/TWL XL LVL3 (GOWN DISPOSABLE) ×6
KIT BASIN OR (CUSTOM PROCEDURE TRAY) ×3 IMPLANT
KIT TURNOVER KIT B (KITS) ×3 IMPLANT
MANIFOLD NEPTUNE II (INSTRUMENTS) ×3 IMPLANT
METAGLENE DELTA EXTEND (Trauma) IMPLANT
METAGLENE DXTEND (Trauma) ×3 IMPLANT
MODULAR STEM 14 (Trauma) ×3 IMPLANT
NDL 1/2 CIR MAYO (NEEDLE) ×1 IMPLANT
NDL HYPO 25GX1X1/2 BEV (NEEDLE) ×1 IMPLANT
NDL SUT 6 .5 CRC .975X.05 MAYO (NEEDLE) ×1 IMPLANT
NEEDLE 1/2 CIR MAYO (NEEDLE) ×3 IMPLANT
NEEDLE HYPO 25GX1X1/2 BEV (NEEDLE) ×3 IMPLANT
NEEDLE MAYO TAPER (NEEDLE) ×3
NS IRRIG 1000ML POUR BTL (IV SOLUTION) ×5 IMPLANT
PACK SHOULDER (CUSTOM PROCEDURE TRAY) ×3 IMPLANT
PAD ABD 8X10 STRL (GAUZE/BANDAGES/DRESSINGS) ×2 IMPLANT
PAD ARMBOARD 7.5X6 YLW CONV (MISCELLANEOUS) ×6 IMPLANT
PIN GUIDE 1.2 (PIN) ×2 IMPLANT
PIN GUIDE GLENOPHERE 1.5MX300M (PIN) ×2 IMPLANT
PIN METAGLENE 2.5 (PIN) ×2 IMPLANT
SCREW 4.5X18MM (Screw) ×3 IMPLANT
SCREW 4.5X24MM (Screw) ×3 IMPLANT
SCREW 48L (Screw) ×2 IMPLANT
SCREW BN 18X4.5XSTRL SHLDR (Screw) IMPLANT
SCREW BN 24X4.5XLCK STRL (Screw) IMPLANT
SCREW LOCK DELTA XTEND 4.5X30 (Screw) ×2 IMPLANT
SLING ARM FOAM STRAP LRG (SOFTGOODS) IMPLANT
SMARTMIX MINI TOWER (MISCELLANEOUS) ×3
SPACER 42 PLUS 6 (Orthopedic Implant) ×2 IMPLANT
SPONGE LAP 18X18 X RAY DECT (DISPOSABLE) ×3 IMPLANT
SPONGE LAP 4X18 RFD (DISPOSABLE) ×3 IMPLANT
SPONGE SURGIFOAM ABS GEL SZ50 (HEMOSTASIS) IMPLANT
STAPLER VISISTAT 35W (STAPLE) ×2 IMPLANT
STEM MODULAR 14 (Trauma) IMPLANT
STRIP CLOSURE SKIN 1/2X4 (GAUZE/BANDAGES/DRESSINGS) ×2 IMPLANT
SUCTION FRAZIER HANDLE 10FR (MISCELLANEOUS) ×2
SUCTION TUBE FRAZIER 10FR DISP (MISCELLANEOUS) ×1 IMPLANT
SUT FIBERWIRE #2 38 T-5 BLUE (SUTURE) ×6
SUT MNCRL AB 4-0 PS2 18 (SUTURE) ×3 IMPLANT
SUT VIC AB 0 CT1 27 (SUTURE) ×3
SUT VIC AB 0 CT1 27XBRD ANBCTR (SUTURE) ×1 IMPLANT
SUT VIC AB 0 CT2 27 (SUTURE) ×3 IMPLANT
SUT VIC AB 2-0 CT1 27 (SUTURE) ×3
SUT VIC AB 2-0 CT1 TAPERPNT 27 (SUTURE) ×1 IMPLANT
SUT VICRYL AB 2 0 TIES (SUTURE) IMPLANT
SUTURE FIBERWR #2 38 T-5 BLUE (SUTURE) ×2 IMPLANT
SYR CONTROL 10ML LL (SYRINGE) ×3 IMPLANT
TOWEL OR 17X24 6PK STRL BLUE (TOWEL DISPOSABLE) ×3 IMPLANT
TOWEL OR 17X26 10 PK STRL BLUE (TOWEL DISPOSABLE) ×3 IMPLANT
TOWER SMARTMIX MINI (MISCELLANEOUS) ×1 IMPLANT
YANKAUER SUCT BULB TIP NO VENT (SUCTIONS) ×2 IMPLANT

## 2018-04-04 NOTE — Anesthesia Procedure Notes (Signed)
Anesthesia Regional Block: Interscalene brachial plexus block   Pre-Anesthetic Checklist: ,, timeout performed, Correct Patient, Correct Site, Correct Laterality, Correct Procedure, Correct Position, site marked, Risks and benefits discussed,  Surgical consent,  Pre-op evaluation,  At surgeon's request and post-op pain management  Laterality: Right and Upper  Prep: chloraprep       Needles:  Injection technique: Single-shot  Needle Type: Echogenic Stimulator Needle     Needle Length: 9cm  Needle Gauge: 21     Additional Needles:   Procedures:, nerve stimulator,,, ultrasound used (permanent image in chart),,,,   Nerve Stimulator or Paresthesia:  Response: hand twitch, 0.4 mA, 0.1 ms,   Additional Responses:   Narrative:  Start time: 04/04/2018 1:20 PM End time: 04/04/2018 1:33 PM Injection made incrementally with aspirations every 5 mL.  Performed by: Personally  Anesthesiologist: Jairo Ben, MD  Additional Notes: Pt identified in Holding room.  Monitors applied. Working IV access confirmed. Sterile prep, drape R clavicle and neck.  #21ga ECHOgenic PNS to hand twitch at 0.28mA threshold with US guidance.  100cc 0.5% Bupivacaine with 10cc Exparel injected incrementally after negative test dose.  Patient asymptomatic, VSS, no heme aspirated, tolerated well.  Sandford Craze, MD

## 2018-04-04 NOTE — Progress Notes (Signed)
RT set up patient CPAP HS at beside. Patient has home mask and is able to place it on when he is ready.

## 2018-04-04 NOTE — Transfer of Care (Signed)
Immediate Anesthesia Transfer of Care Note  Patient: Joe Vance  Procedure(s) Performed: REVERSE SHOULDER ARTHROPLASTY (Right )  Patient Location: PACU  Anesthesia Type:GA combined with regional for post-op pain  Level of Consciousness: drowsy and patient cooperative  Airway & Oxygen Therapy: Patient Spontanous Breathing and Patient connected to nasal cannula oxygen  Post-op Assessment: Report given to RN and Post -op Vital signs reviewed and stable  Post vital signs: Reviewed and stable  Last Vitals:  Vitals Value Taken Time  BP 122/71 04/04/2018  5:31 PM  Temp 36.2 C 04/04/2018  5:31 PM  Pulse 73 04/04/2018  5:32 PM  Resp 15 04/04/2018  5:32 PM  SpO2 100 % 04/04/2018  5:32 PM  Vitals shown include unvalidated device data.  Last Pain:  Vitals:   04/04/18 1731  TempSrc:   PainSc: (P) 0-No pain         Complications: No apparent anesthesia complications

## 2018-04-04 NOTE — Anesthesia Procedure Notes (Signed)
Procedure Name: Intubation Performed by: Ezekiel Ina, CRNA Pre-anesthesia Checklist: Patient identified, Emergency Drugs available, Suction available and Patient being monitored Patient Re-evaluated:Patient Re-evaluated prior to induction Oxygen Delivery Method: Circle System Utilized Preoxygenation: Pre-oxygenation with 100% oxygen Induction Type: IV induction Ventilation: Mask ventilation with difficulty, Oral airway inserted - appropriate to patient size and Two handed mask ventilation required Laryngoscope Size: Glidescope and 4 Grade View: Grade I Tube type: Oral Tube size: 7.5 mm Number of attempts: 1 Airway Equipment and Method: Stylet and Oral airway Placement Confirmation: ETT inserted through vocal cords under direct vision,  positive ETCO2 and breath sounds checked- equal and bilateral Secured at: 25 cm Tube secured with: Tape Dental Injury: Teeth and Oropharynx as per pre-operative assessment

## 2018-04-04 NOTE — Progress Notes (Signed)
Post op H&H drawn in PACU. Hbg 11.2- Dr Desmond Lope notified, will not transfuse second unit of PRBC.

## 2018-04-04 NOTE — Progress Notes (Signed)
Patient arrived on floor from pacu. A&Ox4 with complaints of nausea & vomiting. Patient states that he usually has post-op nausea & vomiting. Gauze Dressing clean, dry and intact on right shoulder with ice. Will continue to monitor.

## 2018-04-04 NOTE — Interval H&P Note (Signed)
History and Physical Interval Note:  04/04/2018 1:50 PM  Joe Vance  has presented today for surgery, with the diagnosis of right shoulder osteoarthritis, end stage  The various methods of treatment have been discussed with the patient and family. After consideration of risks, benefits and other options for treatment, the patient has consented to  Procedure(s): ANATOMIC TOTAL SHOULDER ARTHROPLASTY (Right) as a surgical intervention .  The patient's history has been reviewed, patient examined, no change in status, stable for surgery.  I have reviewed the patient's chart and labs.  Questions were answered to the patient's satisfaction.     Alissah Redmon,STEVEN R

## 2018-04-04 NOTE — Op Note (Signed)
NAME: LAYN, KYE MEDICAL RECORD [id number] ACCOUNT [id number] DATE OF BIRTH:[birth year] FACILITY: [facility] LOCATION: [facility] PHYSICIAN:STEVEN Russ Halo, MD  OPERATIVE REPORT  DATE OF PROCEDURE:  04/04/2018  PREOPERATIVE DIAGNOSIS:  Right shoulder end-stage osteoarthritis.  POSTOPERATIVE DIAGNOSES:   1.  Right shoulder end-stage osteoarthritis. 2.  Subscapularis insufficiency.  PROCEDURE PERFORMED:  Right reverse total shoulder arthroplasty using DePuy Delta Xtend prosthesis.  ATTENDING SURGEON:  Malon Kindle, MD  ASSISTANT:  Modesto Charon, [place], who was scrubbed during the entire procedure and necessary for satisfactory completion of surgery.  ANESTHESIA:  General anesthesia was used plus interscalene block.  ESTIMATED BLOOD LOSS:  500 mL.  FLUID REPLACEMENT:  One unit packed red cells 2000 mL crystalloid.  INSTRUMENT COUNTS:  Correct.  COMPLICATIONS:  No complications.  ANTIBIOTICS:  Perioperative antibiotics were given.  INDICATIONS:  The patient is a 67 year old male with worsening right shoulder pain and dysfunction.  The patient now basically holds his arm to the side.  He is taking narcotic pain medications to control his pain.  Having failed conservative management,  he desires operative treatment to restore function and eliminate pain.  Preoperative MRI scan indicating an intact rotator cuff.  Plan was for an anatomic shoulder placement for the 67 year old with end-stage arthritis.  Risks and benefits of surgery  discussed in detail with the patient.  Informed consent obtained.  DESCRIPTION OF PROCEDURE:  After an adequate level of anesthesia was achieved, the patient was positioned in the modified beach chair position.  Right shoulder correctly identified and sterilely prepped and draped in the usual manner.  Time-out called,  verifying correct patient, correct site and deltopectoral incision utilized.  Dissection down through subcutaneous tissues.  Cephalic vein  identified, taken laterally with the deltoid pectoralis taken medially.  The patient was noted to be bleeding  fairly significantly throughout the entire procedure.  Basically every cut surface was bleeding significantly.  Bovie electrocautery was used to control hemostasis as best as possible, but we were definitely losing blood.  A biceps tenodesis in situ was  performed with 0 Vicryl figure-of-eight suture x2.  The subscapularis was peeled subperiosteally off the lesser tuberosity.  Large osteophytes in the lesser tuberosity and around the medial neck of the humerus had basically attrited the subscapularis  thus it was extremely thin and my concern was that the quality of the tendon was poor and that more than likely the subscapularis would not last through the recovery period and heal properly.  Based on this insufficiency, I had been a big concern about  the patient not regaining range of motion and also potentially failing his standard arthroplasty fairly soon afterwards.  Thus, I went out and spoke to the patient's wife recommending transferring to a reverse shoulder replacement, which would not  require the subscapularis to be functional following surgery in order to have excellent pain relief.  Their primary concern was pain relief and we felt that the reverse shoulder would be the most predictable in terms of lasting and providing good pain  relief and the decision was made and the wife concurred with that.  We then went back and proceeded with reverse shoulder replacement.  I extended the shoulder released, the supraspinatus and part of the infraspinatus.  I entered the proximal humerus  with a 6 mm reamer, reaming up to a size 14.  We then placed our 14 mm intramedullary resection guide for the head resection and resected it at 155.  Removed excess osteophytes with a rongeur and subluxed  the humerus posteriorly, again quite a bit of  bleeding during the surgery.  I discussed with anesthesia and  we did get an i-STAT halfway through surgery, which showed that he had gone from 15 hemoglobin down to 11.  A decision was made to go ahead and start getting blood up to replace that.  At this  point, we gained a 360-degree view of the glenoid face.  We removed the capsule labral tissue on the periphery and the biceps stump.  We then found the center point of the glenoid face and centering low we placed our guide pin for our baseplate  preparation.  We then reamed for the metaglene baseplate and then drilled our central peg hole.  We did a peripheral hand reaming and then impacted the metaglene into position.  We then placed a 48 locked screw inferiorly, a 30 at the base of the  coracoid, a 24 locked posteriorly and an 18 nonlocked anteriorly.  We had good baseplate fixation.  We then took a 42+4 lateralized glenosphere and impacted that and screwed that home onto the baseplate.  We did a finger sweep to make sure the axillary  nerve was free and clear and no soft tissue was stuck under the glenosphere.  Once that was secure and in position, we directed our attention back towards the humeral side.  Multiple large loose bodies were removed from the shoulder during the entire  procedure.  The bleeding began to slow down as we are transfusing that first unit of blood.  At this point, we went ahead and used the metaphyseal reamer to ream for the size 2 centered metaphysis and then we trialled with the 14 body size 2 centered set  on the setting and placed in 10 degrees of retroversion.  We used a 42+3 trial initially, which gave good initial stability and we then went ahead and removed all the trial components, irrigated thoroughly, selected the HA coated press-fit components,  so 14 body size 2 centered, placed on the zero setting and impacted in 10 degrees of retroversion with an impaction grafting technique with available bone graft.  We then selected the 42+6 and trialed that.  I felt like that gave [place] good  stability and  then selected the real 42+6 poly, impacted that in position and then reduced the shoulder.  There was a nice little pop when bone back in and everything moved together as a unit with distal traction and pull.  No gapping with external rotation.  We  irrigated thoroughly, resected the remnant of the subscapularis and then went ahead and repaired the deltopectoral interval with 0 Vicryl suture followed by 2-0 Vicryl for subcutaneous closure and staples for skin.  Sterile compressive bandage were  applied.  The patient awakened and taken to recovery room in stable condition.  TN/NUANCE  D:04/04/2018 T:04/04/2018 JOB:[id number]

## 2018-04-04 NOTE — Progress Notes (Signed)
VS taken on arrival to floor  

## 2018-04-04 NOTE — Brief Op Note (Signed)
04/04/2018  5:15 PM  PATIENT:  Joe Vance  67 y.o. male  PRE-OPERATIVE DIAGNOSIS:  right shoulder osteoarthritis, end stage  POST-OPERATIVE DIAGNOSIS:  right shoulder osteoarthritis, end stage with subscapularis insufficiency  PROCEDURE:  Procedure(s): REVERSE SHOULDER ARTHROPLASTY (Right) DePuy Delta Xtend  SURGEON:  Surgeon(s) and Role:    Beverely Low, MD - Primary  PHYSICIAN ASSISTANT:   ASSISTANTS: Thea Gist, PA-C   ANESTHESIA:   regional and general  EBL:  1800 mL   BLOOD ADMINISTERED:1 unit of PRBCs  DRAINS: none   LOCAL MEDICATIONS USED:  MARCAINE     SPECIMEN:  No Specimen  DISPOSITION OF SPECIMEN:  N/A  COUNTS:  YES  TOURNIQUET:  * No tourniquets in log *  DICTATION: .Other Dictation: Dictation Number [id number]  PLAN OF CARE: Admit to inpatient   PATIENT DISPOSITION:  PACU - hemodynamically stable.   Delay start of Pharmacological VTE agent (>24hrs) due to surgical blood loss or risk of bleeding: not applicable

## 2018-04-04 NOTE — Anesthesia Preprocedure Evaluation (Addendum)
Anesthesia Evaluation  Patient identified by MRN, date of birth, ID band Patient awake    Reviewed: Allergy & Precautions, NPO status , Patient's Chart, lab work & pertinent test results  History of Anesthesia Complications (+) PONV  Airway Mallampati: II  TM Distance: >3 FB Neck ROM: Full    Dental  (+) Dental Advisory Given   Pulmonary sleep apnea and Continuous Positive Airway Pressure Ventilation ,    breath sounds clear to auscultation       Cardiovascular hypertension, Pt. on medications and Pt. on home beta blockers (-) angina Rhythm:Regular Rate:Normal  '13 myoview: low risk   Neuro/Psych  Headaches,    GI/Hepatic Neg liver ROS, GERD  Medicated and Controlled,  Endo/Other  Morbid obesity  Renal/GU negative Renal ROS     Musculoskeletal  (+) Arthritis ,   Abdominal (+) + obese,   Peds  Hematology negative hematology ROS (+)   Anesthesia Other Findings   Reproductive/Obstetrics                            Anesthesia Physical Anesthesia Plan  ASA: III  Anesthesia Plan: General   Post-op Pain Management: GA combined w/ Regional for post-op pain   Induction: Intravenous  PONV Risk Score and Plan: 3 and Ondansetron, Dexamethasone and Scopolamine patch - Pre-op  Airway Management Planned: Oral ETT  Additional Equipment:   Intra-op Plan:   Post-operative Plan: Extubation in OR  Informed Consent: I have reviewed the patients History and Physical, chart, labs and discussed the procedure including the risks, benefits and alternatives for the proposed anesthesia with the patient or authorized representative who has indicated his/her understanding and acceptance.   Dental advisory given  Plan Discussed with: CRNA and Surgeon  Anesthesia Plan Comments: (Plan routine monitors, GETA with interscalene block for post op analgesia)        Anesthesia Quick Evaluation

## 2018-04-05 ENCOUNTER — Encounter (HOSPITAL_COMMUNITY): Payer: Self-pay

## 2018-04-05 ENCOUNTER — Other Ambulatory Visit: Payer: Self-pay

## 2018-04-05 LAB — BASIC METABOLIC PANEL
Anion gap: 8 (ref 5–15)
BUN: 12 mg/dL (ref 8–23)
CO2: 27 mmol/L (ref 22–32)
CREATININE: 0.8 mg/dL (ref 0.61–1.24)
Calcium: 9 mg/dL (ref 8.9–10.3)
Chloride: 102 mmol/L (ref 98–111)
GFR calc Af Amer: >60 mL/min (ref >60)
GFR calc non Af Amer: >60 mL/min (ref >60)
Glucose, Bld: 152 mg/dL — ABNORMAL HIGH (ref 70–99)
POTASSIUM: 3.6 mmol/L (ref 3.5–5.1)
Sodium: 137 mmol/L (ref 135–145)

## 2018-04-05 LAB — TYPE AND SCREEN
ABO/RH(D): A NEG
Antibody Screen: NEGATIVE
UNIT DIVISION: 0
Unit division: 0

## 2018-04-05 LAB — BPAM RBC
BLOOD PRODUCT EXPIRATION DATE: 201912102359
Blood Product Expiration Date: 201912102359
ISSUE DATE / TIME: 201911081640
ISSUE DATE / TIME: 201911081640
UNIT TYPE AND RH: 600
Unit Type and Rh: 600

## 2018-04-05 LAB — HEMOGLOBIN AND HEMATOCRIT, BLOOD
HCT: 32.6 % — ABNORMAL LOW (ref 39.0–52.0)
Hemoglobin: 11.7 g/dL — ABNORMAL LOW (ref 13.0–17.0)

## 2018-04-05 NOTE — Plan of Care (Signed)
  Problem: Clinical Measurements: Goal: Will remain free from infection Outcome: Progressing Goal: Respiratory complications will improve Outcome: Progressing   Problem: Activity: Goal: Risk for activity intolerance will decrease Outcome: Progressing   Problem: Safety: Goal: Ability to remain free from injury will improve Outcome: Progressing   Problem: Skin Integrity: Goal: Risk for impaired skin integrity will decrease Outcome: Progressing   

## 2018-04-05 NOTE — Progress Notes (Signed)
Joe Vance  MRN: [id number] DOB/Age: [birth year] 67 y.o. [facility] Procedure: Procedure(s) (LRB): REVERSE SHOULDER ARTHROPLASTY (Right)     Subjective: First time out of bed, sitting up in chair. Pain is controlled. Voiding at bedside has not been up walking yet  Vital Signs Temp:  [97.2 F (36.2 C)-98 F (36.7 C)] 97.7 F (36.5 C) (11/08 1927) Pulse Rate:  [56-89] 73 (11/09 0551) Resp:  [13-24] 17 (11/09 0551) BP: (118-175)/(63-96) 141/81 (11/09 0551) SpO2:  [92 %-100 %] 95 % (11/09 0551) Weight:  [147.4 kg] 147.4 kg (11/08 1059)  Lab Results Recent Labs    04/04/18 1950 04/05/18 0311  WBC 12.0*  --   HGB 12.5* 11.7*  HCT 34.6* 32.6*  PLT 250  --    BMET Recent Labs    04/04/18 1607 04/05/18 0311  NA 139 137  K 3.8 3.6  CL  --  102  CO2  --  27  GLUCOSE 109* 152*  BUN  --  12  CREATININE  --  0.80  CALCIUM  --  9.0   INR  Date Value Ref Range Status  04/04/2018 1.05  Final    Comment:    Performed at [facility], [address], [place], [place] [address]     Exam Right shoulder dressing is dry with no sign of active bleeding. Right upper extremity with no swelling, motor returned to hand        Plan Pt was told he may stay extra night due to bleeding encountered at surgery. No active signs of bleeding Will see how he does getting up today  Ralene Bathe PA-C  04/05/2018, 8:34 AM Contact # [phone]

## 2018-04-05 NOTE — Evaluation (Signed)
Occupational Therapy Evaluation Patient Details Name: Joe Vance MRN: [id number] DOB: Mar 21, 1951 Today's Date: 04/05/2018    History of Present Illness Pt is a 67 y.o. male s/p R reverse total shoulder arthroplasty. He has a PMH significant for GERD, HTN, hyperlipidemia, migraines, morbid obesity, osteoarthritis, and sleep apnea. He has a surgical history of cholecystectomy, inguinal hernia repair x2, and knee surgery x2.    Clinical Impression   PTA, pt was able to complete ADL with modified independence and was working as a Runner, broadcasting/film/video. He currently requires max assist for LB ADL and mod assist for UB ADL tasks. He was able to complete simulated ambulating toilet transfer with min assist using cane and knee brace today. Initiated post-operative shoulder education with pt and wife including compensatory strategies for all ADL as well as no ROM of shoulder and other precautions. Pt would benefit from continued OT services while admitted to improve independence and safety with ADL and elbow/wrist/hand HEP prior to returning home with assistance from wife.     Follow Up Recommendations  Supervision/Assistance - 24 hour;Follow surgeon's recommendation for DC plan and follow-up therapies    Equipment Recommendations  None recommended by OT    Recommendations for Other Services       Precautions / Restrictions Precautions Precautions: Shoulder Type of Shoulder Precautions: Conservative protocol: NWB RUE, sling at all times except ADL/exercise, AROM elbow/wrist/hand, no ROM of R shoulder.  Shoulder Interventions: Shoulder sling/immobilizer;Off for dressing/bathing/exercises Precaution Booklet Issued: Yes (comment) Precaution Comments: Reviewed all precautions with pt and wife Required Braces or Orthoses: Sling(R shoulder immobilizer) Restrictions Weight Bearing Restrictions: Yes RUE Weight Bearing: Non weight bearing      Mobility Bed Mobility Overal bed mobility: Needs Assistance Bed  Mobility: Supine to Sit     Supine to sit: Min guard     General bed mobility comments: Hands on guarding transitioning to EOB to the left with cues to avoid movement of R shoulder.   Transfers Overall transfer level: Needs assistance Equipment used: Straight cane Transfers: Sit to/from Stand Sit to Stand: Min assist         General transfer comment: Min assist for stability.    Balance Overall balance assessment: Needs assistance Sitting-balance support: No upper extremity supported;Feet supported Sitting balance-Leahy Scale: Good     Standing balance support: During functional activity;Single extremity supported Standing balance-Leahy Scale: Poor Standing balance comment: relied on single UE support                           ADL either performed or assessed with clinical judgement   ADL Overall ADL's : Needs assistance/impaired Eating/Feeding: Set up;Sitting   Grooming: Set up;Sitting   Upper Body Bathing: Moderate assistance;Sitting   Lower Body Bathing: Maximal assistance;Sit to/from stand   Upper Body Dressing : Moderate assistance;Sitting Upper Body Dressing Details (indicate cue type and reason): including sling Lower Body Dressing: Maximal assistance   Toilet Transfer: Minimal assistance;Ambulation(short distance with cane; simulated ambulating bed>chair. )   Toileting- Clothing Manipulation and Hygiene: Minimal assistance;Sit to/from stand       Functional mobility during ADLs: Minimal assistance;Cane General ADL Comments: Pt with difficulty with ADL participation requiring more assistance than pre-operatively.      Vision Patient Visual Report: No change from baseline Vision Assessment?: No apparent visual deficits     Perception     Praxis      Pertinent Vitals/Pain Pain Assessment: Faces Faces Pain Scale: Hurts little more Pain  Location: R shoulder Pain Descriptors / Indicators: Aching;Operative site guarding Pain  Intervention(s): Limited activity within patient's tolerance;Monitored during session;Repositioned;Ice applied     Hand Dominance Right   Extremity/Trunk Assessment Upper Extremity Assessment Upper Extremity Assessment: RUE deficits/detail RUE Deficits / Details: Post-operative pain. Sensation and motor control diminished due to nerve block wearing off. Pt with functional elbow/wrist/hand AROM.   Lower Extremity Assessment Lower Extremity Assessment: LLE deficits/detail LLE Deficits / Details: reports needing knee replacement and uses knee brace       Communication Communication Communication: No difficulties   Cognition Arousal/Alertness: Awake/alert Behavior During Therapy: WFL for tasks assessed/performed Overall Cognitive Status: Within Functional Limits for tasks assessed                                 General Comments: Functional although at times missing context clues of conversation likely due to post-operative state   General Comments  Pt's wife present throughout session. She is a physical therapist in the school system    Exercises Exercises: Shoulder Shoulder Exercises Elbow Flexion: AROM;Right;10 reps Elbow Extension: AROM;Right;10 reps Wrist Flexion: AROM;Right;10 reps Wrist Extension: AROM;Right;10 reps Digit Composite Flexion: AROM;Right;10 reps Composite Extension: AROM;Right;10 reps Neck Flexion: AROM;5 reps Neck Lateral Flexion - Right: AROM;5 reps Neck Lateral Flexion - Left: AROM;5 reps   Shoulder Instructions Shoulder Instructions Donning/doffing shirt without moving shoulder: Moderate assistance Method for sponge bathing under operated UE: Moderate assistance Donning/doffing sling/immobilizer: Moderate assistance Correct positioning of sling/immobilizer: Moderate assistance ROM for elbow, wrist and digits of operated UE: Supervision/safety Sling wearing schedule (on at all times/off for ADL's): Supervision/safety Proper positioning  of operated UE when showering: Supervision/safety Positioning of UE while sleeping: Minimal assistance    Home Living Family/patient expects to be discharged to:: Private residence Living Arrangements: Spouse/significant other;Children Available Help at Discharge: Family;Available 24 hours/day Type of Home: House Home Access: Stairs to enter [employer] of Steps: 3 Entrance Stairs-Rails: Right Home Layout: One level     Bathroom Shower/Tub: Chief Strategy Officer: Handicapped height     Home Equipment: None;Cane - single point(L knee brace)          Prior Functioning/Environment Level of Independence: Independent with assistive device(s)        Comments: works as a Librarian, 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; ambulates with cane and left knee brace        OT Problem List: Decreased strength;Decreased range of motion;Decreased activity tolerance;Impaired balance (sitting and/or standing);Decreased safety awareness;Decreased knowledge of use of DME or AE;Decreased knowledge of precautions;Pain;Impaired UE functional use      OT Treatment/Interventions: Self-care/ADL training;Therapeutic exercise;Energy conservation;DME and/or AE instruction;Therapeutic activities;Patient/family education;Balance training    OT Goals(Current goals can be found in the care plan section) Acute Rehab OT Goals Patient Stated Goal: to get back to walking and feel better OT Goal Formulation: With patient/family Time For Goal Achievement: 04/19/18 Potential to Achieve Goals: Good ADL Goals Pt Will Perform Upper Body Dressing: with min assist;sitting Pt Will Perform Lower Body Dressing: with min assist;sit to/from stand Pt Will Transfer to Toilet: with supervision;ambulating(elevated height toilet seat) Pt Will Perform Toileting - Clothing Manipulation and hygiene: with min assist;sit to/from stand Pt Will Perform Tub/Shower Transfer: Tub transfer;with min  assist;ambulating(with cane) Pt/caregiver will Perform Home Exercise Program: Right Upper extremity;With written HEP provided;With Supervision(elbow/wrist/hand AROM per protocol)  OT Frequency: Min 2X/week   Barriers to D/C:  Co-evaluation              AM-PAC PT "6 Clicks" Daily Activity     Outcome Measure Help from another person eating meals?: None Help from another person taking care of personal grooming?: None Help from another person toileting, which includes using toliet, bedpan, or urinal?: A Little Help from another person bathing (including washing, rinsing, drying)?: A Lot Help from another person to put on and taking off regular upper body clothing?: A Lot Help from another person to put on and taking off regular lower body clothing?: A Lot 6 Click Score: 17   End of Session Equipment Utilized During Treatment: Gait belt;Rolling walker Nurse Communication: Mobility status  Activity Tolerance: Patient tolerated treatment well Patient left: in chair;with call bell/phone within reach  OT Visit Diagnosis: Other abnormalities of gait and mobility (R26.89);Pain Pain - Right/Left: Right Pain - part of body: Shoulder                Time: 1610-9604 OT Time Calculation (min): 42 min Charges:  OT General Charges $OT Visit: 1 Visit OT Evaluation $OT Eval Moderate Complexity: 1 Mod OT Treatments $Self Care/Home Management : 8-22 mins $Therapeutic Exercise: 8-22 mins  Derrell Lolling, OTR/L Acute Rehabilitation Services Office [phone]   Joe Vance A Joe Vance 04/05/2018, 10:00 AM

## 2018-04-05 NOTE — Anesthesia Postprocedure Evaluation (Signed)
Anesthesia Post Note  Patient: Joe Vance  Procedure(s) Performed: REVERSE SHOULDER ARTHROPLASTY (Right )     Patient location during evaluation: PACU Anesthesia Type: General Level of consciousness: awake and alert Pain management: pain level controlled Vital Signs Assessment: post-procedure vital signs reviewed and stable Respiratory status: spontaneous breathing, nonlabored ventilation, respiratory function stable and patient connected to nasal cannula oxygen Cardiovascular status: blood pressure returned to baseline and stable Postop Assessment: no apparent nausea or vomiting Anesthetic complications: no Comments: Hgb in PACU 12.5    Last Vitals:  Vitals:   04/05/18 1017 04/05/18 1251  BP: (!) 147/63 (!) 160/115  Pulse: 72 80  Resp:    Temp:  36.7 C  SpO2: 97% 96%    Last Pain:  Vitals:   04/05/18 1251  TempSrc: Oral  PainSc:                  Shelton Silvas

## 2018-04-06 NOTE — Discharge Instructions (Signed)
Shoulder Joint Replacement, Care After This sheet gives you information about how to care for yourself after your procedure. Your health care provider may also give you more specific instructions. If you have problems or questions, contact your health care provider. What can I expect after the procedure? After your procedure, it is common to have:  A bruised and stiff shoulder.  A bruised and stiff arm.  Some pain.  Follow these instructions at home: If you have a sling:  Wear the sling as told by your health care provider. Remove it only as told by your health care provider.  Loosen the slingif your fingers tingle, become numb, or turn cold and blue.  Keep the sling clean.  If the sling is not waterproof: ? Do not let it get wet. ? Cover it with a watertight covering when you take a bath or a shower. Bathing  Do not take baths, swim, or use a hot tub until your health care provider approves. Ask your health care provider if you can take showers. You may only be allowed to take sponge baths for bathing.  If your sling is not waterproof, cover it with a watertight covering when you take a bath or a shower.  Keep the bandage (dressing) dry until your health care provider says it can be removed. Managing pain, stiffness, and swelling  If directed, put ice on the affected area. ? If you have a removable sling, remove it as told by your health care provider. ? Put ice in a plastic bag. ? Place a towel between your skin and the bag. ? Leave the ice on for 20 minutes, 2-3 times a day.  Move your fingers often to avoid stiffness and to lessen swelling. Driving  Do not drive or use heavy machinery while taking prescription pain medicine.  Do not drive for 2-4 weeks after surgery or as told by your health care provider. Medicine  Take over-the-counter and prescription medicines only as told by your health care provider.  If you were prescribed an antibiotic medicine, use it  as told by your health care provider. Do not stop using the antibiotic even if you start to feel better. Incision care  Follow instructions from your health care provider about how to take care of your incision area. Make sure you: ? Wash your hands with soap and water before you change your bandage (dressing). If soap and water are not available, use hand sanitizer. ? Change your dressing as told by your health care provider. ? Leave staples, stitches (sutures), skin glue, or adhesive strips in place. These skin closures may need to stay in place for 2 weeks or longer. If adhesive strip edges start to loosen and curl up, you may trim the loose edges. Do not remove adhesive strips completely unless your health care provider tells you to do that.  If you have a tube to remove drainage, follow instructions from your health care provider about caring for it. Do not remove the drain tube or any dressings around the tube opening unless your health care provider approves.  Check your incision area every day for signs of infection. Check for: ? More redness, swelling, or pain. ? More fluid or blood. ? Warmth. ? Pus or a bad smell. Activity  Do not use your arm to push yourself up in bed or from a chair. This requires too much muscle.  Follow lifting restrictions as told: ? Do not lift anything that is heavier than a  cup of coffee for the first 6 weeks after surgery, or as told by your health care provider. ? Do not lift anything that is heavier than 10 lb (4.5 kg) for 6 months, or as told by your health care provider.  Do exercises, including physical therapy, as told by your health care provider.  Try not to overuse your shoulder. This includes repetitive pushing or pulling. Early overuse of the shoulder may result in later problems. (Overusing the shoulder is easy to do when your pain goes away for the first time.)  Avoid overstretching your arm for 6 weeks after surgery, or as told by your  health care provider.  Avoid sitting for a long time without moving. Get up and move around one or more times every few hours.  Ask for help with some activities. Your health care provider may be able to suggest a clinic or agency for this if you do not have home support.  Do not participate in contact sports. General instructions  Keep all follow-up visits as told by your health care provider. This is important.  Do not use any products that contain nicotine or tobacco, such as cigarettes and e-cigarettes. These can delay healing. If you need help quitting, ask your health care provider. Contact a health care provider if:  You develop a rash.  You have a fever.  You have more redness, swelling, or pain in the incision area.  You have more fluid or blood coming from your incision area.  You have more pain when moving your shoulder. Get help right away if:  Your incision area feels warm to the touch.  You have pus or a bad smell coming from your incision area.  The edges of the incision site break open after sutures have been removed.  You have chest pain or shortness of breath. Summary  It is common to have pain and stiffness in your shoulder and arm after the procedure. Put ice on the affected area and take pain medicine as told by your health care provider.  Do not use your arm to push yourself up in bed or from a chair.  Do exercises, including physical therapy, as told by your health care provider.  Check your incision area daily. Call your health care provider if you see signs of infection. This information is not intended to replace advice given to you by your health care provider. Make sure you discuss any questions you have with your health care provider. Document Released: 12/01/2004 Document Revised: 02/27/2016 Document Reviewed: 02/27/2016 Elsevier Interactive Patient Education  2018 [employer]. Ice to the shoulder constantly.  Keep the incision covered and  clean and dry for one week, then ok to get it wet in the shower.  Do exercise as instructed several times per day.  DO NOT reach behind your back or push up out of a chair with the operative arm.  Use a sling while you are up and around for comfort, may remove while seated.  Keep pillow propped behind the operative elbow.  Follow up with Dr Ranell Patrick in two weeks in the office, call [phone] for appt

## 2018-04-06 NOTE — Progress Notes (Signed)
Nsg Discharge Note  Admit Date:  04/04/2018 Discharge date: 04/06/2018   Jefm Miles to be D/C'd Home per MD order.  AVS completed.  Copy for chart, and copy for patient signed, and dated. Patient/caregiver able to verbalize understanding.  Discharge Medication: Allergies as of 04/06/2018      Reactions   Sulfa Antibiotics Hives   Sulfa Drugs Cross Reactors Hives   Rosuvastatin Other (See Comments)   UNSPECIFIED REACTION    Simvastatin Other (See Comments)   UNSPECIFIED REACTION    Allopurinol Rash   rash   Keflex [cephalexin] Nausea And Vomiting      Medication List    TAKE these medications   acyclovir 800 MG tablet Commonly known as:  ZOVIRAX Take 400 mg by mouth 2 (two) times daily.   amitriptyline 50 MG tablet Commonly known as:  ELAVIL Take 50 mg by mouth 2 (two) times daily.   amLODipine 10 MG tablet Commonly known as:  NORVASC Take 10 mg by mouth daily. Notes to patient:  04/07/18   aspirin 81 MG tablet Take 81 mg by mouth daily.   butalbital-acetaminophen-caffeine 50-325-40 MG tablet Commonly known as:  FIORICET, ESGIC Take 1 tablet by mouth every 6 (six) hours as needed for headache.   cloNIDine 0.1 MG tablet Commonly known as:  CATAPRES Take 0.1 mg by mouth 2 (two) times daily.   DEPO-TESTOSTERONE 200 MG/ML injection Generic drug:  testosterone cypionate Inject 200 mg into the muscle every 28 (twenty-eight) days.   diphenhydrAMINE 25 MG tablet Commonly known as:  BENADRYL Take 75 mg by mouth at bedtime.   febuxostat 40 MG tablet Commonly known as:  ULORIC Take 40 mg by mouth daily.   ferrous sulfate 325 (65 FE) MG tablet Take 325 mg by mouth 3 (three) times a week.   FISH OIL PO Take 1 capsule by mouth daily. 2400 mg + DHA 630 mg   fluocinonide ointment 0.05 % Commonly known as:  LIDEX Apply 1 application topically 2 (two) times daily as needed (skin irritation).   fluticasone 50 MCG/ACT nasal spray Commonly known as:  FLONASE Place 1  spray into both nostrils at bedtime.   hydrochlorothiazide 25 MG tablet Commonly known as:  HYDRODIURIL Take 25 mg by mouth daily.   HYDROcodone-acetaminophen 5-325 MG tablet Commonly known as:  NORCO/VICODIN Take 1 tablet by mouth every 6 (six) hours as needed (pain).   lisinopril 40 MG tablet Commonly known as:  PRINIVIL,ZESTRIL Take 40 mg by mouth 2 (two) times daily.   meloxicam 15 MG tablet Commonly known as:  MOBIC Take 15 mg by mouth daily.   methocarbamol 500 MG tablet Commonly known as:  ROBAXIN Take 1 tablet (500 mg total) by mouth 3 (three) times daily as needed.   metoprolol tartrate 50 MG tablet Commonly known as:  LOPRESSOR Take 50 mg by mouth 2 (two) times daily.   multivitamin tablet Take 1 tablet by mouth daily.   oxyCODONE-acetaminophen 7.5-325 MG tablet Commonly known as:  PERCOCET Take 1-2 tablets by mouth every 4 (four) hours as needed for moderate pain or severe pain.   pantoprazole 40 MG tablet Commonly known as:  PROTONIX Take 40 mg by mouth daily.   potassium chloride SA 20 MEQ tablet Commonly known as:  K-DUR,KLOR-CON Take 10 mEq by mouth 3 (three) times a week.   Vitamin D (Ergocalciferol) 1.25 MG (50000 UT) Caps capsule Commonly known as:  DRISDOL Take 50,000 Units by mouth 2 (two) times a week.  Discharge Assessment: Vitals:   04/05/18 2203 04/06/18 0626  BP:  (!) 150/75  Pulse: 78 71  Resp: 18 17  Temp:  98.3 F (36.8 C)  SpO2:  95%   Skin clean, dry and intact without evidence of skin break down, no evidence of skin tears noted. IV catheter discontinued intact. Site without signs and symptoms of complications - no redness or edema noted at insertion site, patient denies c/o pain - only slight tenderness at site.  Dressing with slight pressure applied.  D/c Instructions-Education: Discharge instructions given to patient/family with verbalized understanding. D/c education completed with patient/family including follow up  instructions, medication list, d/c activities limitations if indicated, with other d/c instructions as indicated by MD - patient able to verbalize understanding, all questions fully answered. Patient instructed to return to ED, call 911, or call MD for any changes in condition.  Patient escorted via WC, and D/C home via private auto.  Adair Laundry, RN 04/06/2018 9:53 AM

## 2018-04-06 NOTE — Progress Notes (Signed)
   Subjective: 2 Days Post-Op Procedure(s) (LRB): REVERSE SHOULDER ARTHROPLASTY (Right) Patient reports pain as mild.   Patient seen in rounds for Dr. Ranell Patrick. Patient is well, and has had no acute complaints or problems other than some discomfort in the right shoulder. Voiding and reports flatus. No SOB or chest pain.    Objective: Vital signs in last 24 hours: Temp:  [98.1 F (36.7 C)-98.3 F (36.8 C)] 98.3 F (36.8 C) (11/10 0626) Pulse Rate:  [71-80] 71 (11/10 0626) Resp:  [17-18] 17 (11/10 0626) BP: (140-160)/(63-115) 150/75 (11/10 0626) SpO2:  [95 %-97 %] 95 % (11/10 0626)  Intake/Output from previous day:  Intake/Output Summary (Last 24 hours) at 04/06/2018 0832 Last data filed at 04/05/2018 1255 Gross per 24 hour  Intake 410 ml  Output -  Net 410 ml     Labs: Recent Labs    04/04/18 1602 04/04/18 1607 04/04/18 1950 04/05/18 0311  HGB 11.6* 11.9* 12.5* 11.7*   Recent Labs    04/04/18 1950 04/05/18 0311  WBC 12.0*  --   RBC 3.77*  --   HCT 34.6* 32.6*  PLT 250  --    Recent Labs    04/04/18 1607 04/05/18 0311  NA 139 137  K 3.8 3.6  CL  --  102  CO2  --  27  BUN  --  12  CREATININE  --  0.80  GLUCOSE 109* 152*  CALCIUM  --  9.0   Recent Labs    04/04/18 1541  INR 1.05    EXAM General - Patient is Alert and Oriented Extremity - Neurologically intact Sensation intact distally Intact pulses distally No cellulitis present Dressing/Incision - clean, dry, no drainage Motor Function - intact, moving hand and fingers well on exam.   Past Medical History:  Diagnosis Date  . BPH (benign prostatic hyperplasia)   . Complication of anesthesia   . GERD (gastroesophageal reflux disease)   . HTN (hypertension)   . Hyperlipidemia   . Migraines   . Morbid obesity (HCC)   . Osteoarthritis   . Osteoarthritis of shoulder    End stage- Right  . PONV (postoperative nausea and vomiting)   . Sleep apnea    Cpap    Assessment/Plan: 2 Days  Post-Op Procedure(s) (LRB): REVERSE SHOULDER ARTHROPLASTY (Right) Active Problems:   S/P shoulder replacement, right  Estimated body mass index is 45.65 kg/m as calculated from the following:   Height as of this encounter: 5' 10.75" (1.797 m).   Weight as of this encounter: 147.4 kg. Advance diet Up with therapy DVT Prophylaxis - Aspirin  Maintain sling on right UE. Plan for DC home today. Follow up with Dr. Ranell Patrick in 2 weeks.   Dimitri Ped, PA-C Orthopaedic Surgery 04/06/2018, 8:32 AM

## 2018-04-06 NOTE — Progress Notes (Signed)
Occupational Therapy Treatment Patient Details Name: Bodee Lafoe MRN: [id number] DOB: Apr 24, 1951 Today's Date: 04/06/2018    History of present illness Pt is a 67 y.o. male s/p R reverse total shoulder arthroplasty. He has a PMH significant for GERD, HTN, hyperlipidemia, migraines, morbid obesity, osteoarthritis, and sleep apnea. He has a surgical history of cholecystectomy, inguinal hernia repair x2, and knee surgery x2.    OT comments  Pt. Seen for skilled OT with wife present for session.  Modifications created to allow better fit of sling.  Also reviewed HEP and pt. Able to return demo.  Wife reports she will provide assistance with ub/lb adls including meal prep.  Eager for d/c home after therapies.  No further questions from pt. Or spouse.   Follow Up Recommendations  Supervision/Assistance - 24 hour;Follow surgeon's recommendation for DC plan and follow-up therapies    Equipment Recommendations  None recommended by OT    Recommendations for Other Services      Precautions / Restrictions Precautions Precautions: Shoulder Type of Shoulder Precautions: Conservative protocol: NWB RUE, sling at all times except ADL/exercise, AROM elbow/wrist/hand, no ROM of R shoulder.  Shoulder Interventions: Shoulder sling/immobilizer;Off for dressing/bathing/exercises Precaution Comments: Reviewed all precautions with pt and wife Required Braces or Orthoses: Sling Restrictions Weight Bearing Restrictions: Yes RUE Weight Bearing: Non weight bearing       Mobility Bed Mobility                  Transfers                      Balance                                           ADL either performed or assessed with clinical judgement   ADL Overall ADL's : Needs assistance/impaired                 Upper Body Dressing : Moderate assistance;Sitting Upper Body Dressing Details (indicate cue type and reason): including sling-wife and pt. requesting  assistance with modification of sling as waist strap would not fit around pts waist.  splint strapping added with additonal velcro to fit pts circumference comfortably.                     General ADL Comments: wife present for session. reports she will be assisting with all UB/LB ADLS, and all meal prep.       Vision       Perception     Praxis      Cognition Arousal/Alertness: Awake/alert Behavior During Therapy: WFL for tasks assessed/performed Overall Cognitive Status: Within Functional Limits for tasks assessed                                          Exercises General Exercises - Upper Extremity Wrist Flexion: AROM;Right;Seated;5 reps Wrist Extension: AROM;Right;Seated;5 reps Digit Composite Flexion: AROM;Right;5 reps;Seated Composite Extension: AROM;Right;5 reps;Seated Other Exercises Other Exercises: reviewed elbow flex/ext. but sling had just been positioned so pt. reports he will complete these later along with forearm pronation and supination.  reviewed hand out that was provided at previous session that showed all of the HEP exercises   Shoulder Instructions       General Comments  Pertinent Vitals/ Pain       Pain Assessment: No/denies pain  Home Living                                          Prior Functioning/Environment              Frequency  Min 2X/week        Progress Toward Goals  OT Goals(current goals can now be found in the care plan section)  Progress towards OT goals: Progressing toward goals     Plan      Co-evaluation                 AM-PAC PT "6 Clicks" Daily Activity     Outcome Measure   Help from another person eating meals?: None Help from another person taking care of personal grooming?: None Help from another person toileting, which includes using toliet, bedpan, or urinal?: A Little Help from another person bathing (including washing, rinsing, drying)?: A  Lot Help from another person to put on and taking off regular upper body clothing?: A Lot Help from another person to put on and taking off regular lower body clothing?: A Lot 6 Click Score: 17    End of Session Equipment Utilized During Treatment: Other (comment)(sling)  OT Visit Diagnosis: Other abnormalities of gait and mobility (R26.89) Pain - Right/Left: Right Pain - part of body: Shoulder   Activity Tolerance Patient tolerated treatment well   Patient Left in chair;with call bell/phone within reach;with family/visitor present   Nurse Communication          Time: 1610-9604 OT Time Calculation (min): 26 min  Charges: OT General Charges $OT Visit: 1 Visit OT Treatments $Self Care/Home Management : 23-37 mins   Robet Leu, COTA/L 04/06/2018, 10:48 AM

## 2018-04-07 ENCOUNTER — Encounter (HOSPITAL_COMMUNITY): Payer: Self-pay | Admitting: Orthopedic Surgery

## 2018-04-07 ENCOUNTER — Telehealth: Payer: Self-pay

## 2018-04-07 LAB — POCT I-STAT EG7
Acid-base deficit: 2 mmol/L (ref 0.0–2.0)
Bicarbonate: 24 mmol/L (ref 20.0–28.0)
Calcium, Ion: 1.14 mmol/L — ABNORMAL LOW (ref 1.15–1.40)
HEMATOCRIT: 33 % — AB (ref 39.0–52.0)
HEMOGLOBIN: 11.2 g/dL — AB (ref 13.0–17.0)
O2 Saturation: 77 %
PCO2 VEN: 43.6 mmHg — AB (ref 44.0–60.0)
POTASSIUM: 3.5 mmol/L (ref 3.5–5.1)
Sodium: 140 mmol/L (ref 135–145)
TCO2: 25 mmol/L (ref 22–32)
pH, Ven: 7.345 (ref 7.250–7.430)
pO2, Ven: 42 mmHg (ref 32.0–45.0)

## 2018-04-07 LAB — POCT I-STAT 4, (NA,K, GLUC, HGB,HCT)
GLUCOSE: 99 mg/dL (ref 70–99)
HEMATOCRIT: 34 % — AB (ref 39.0–52.0)
Hemoglobin: 11.6 g/dL — ABNORMAL LOW (ref 13.0–17.0)
Potassium: >8.5 mmol/L (ref 3.5–5.1)
Sodium: 135 mmol/L (ref 135–145)

## 2018-04-07 NOTE — Telephone Encounter (Signed)
DWM called about patients high potassium while he was in the hospital 04/04/18 8.5. Since patient was in the hospital he has had 3 other readings since he was there and it is now in range.  Patient is currently taking 10 meq momday, wed and Friday.   Spoke with MMM and per MMM patient was to stay on same dose.   Patient aware and verbalizes.

## 2018-04-08 NOTE — Discharge Summary (Signed)
Orthopedic Discharge Summary        Physician Discharge Summary  Patient ID: Joe Vance MRN: [id number] DOB/AGE: 1951-03-13 67 y.o.  Admit date: 04/04/2018 Discharge date: 04/08/2018   Procedures:  Procedure(s) (LRB): REVERSE SHOULDER ARTHROPLASTY (Right)  Attending Physician:  Dr. Malon Kindle  Admission Diagnoses:   Right shoulder osteoarthritis  Discharge Diagnoses:  Right shoulder osteoarthritis   Past Medical History:  Diagnosis Date  . BPH (benign prostatic hyperplasia)   . Complication of anesthesia   . GERD (gastroesophageal reflux disease)   . HTN (hypertension)   . Hyperlipidemia   . Migraines   . Morbid obesity (HCC)   . Osteoarthritis   . Osteoarthritis of shoulder    End stage- Right  . PONV (postoperative nausea and vomiting)   . Sleep apnea    Cpap    PCP: Elenora Gamma, MD   Discharged Condition: good  Hospital Course:  Patient underwent the above stated procedure on 04/04/2018. Patient tolerated the procedure well and brought to the recovery room in good condition and subsequently to the floor. Patient had an uncomplicated hospital course and was stable for discharge.   Disposition:  with follow up in 2 weeks   Follow-up Information    Beverely Low, MD. Call in 2 weeks.   Specialty:  Orthopedic Surgery Contact information: [address] [place] [address] [phone]             Allergies as of 04/06/2018      Reactions   Sulfa Antibiotics Hives   Sulfa Drugs Cross Reactors Hives   Rosuvastatin Other (See Comments)   UNSPECIFIED REACTION    Simvastatin Other (See Comments)   UNSPECIFIED REACTION    Allopurinol Rash   rash   Keflex [cephalexin] Nausea And Vomiting      Medication List    TAKE these medications   acyclovir 800 MG tablet Commonly known as:  ZOVIRAX Take 400 mg by mouth 2 (two) times daily.   amitriptyline 50 MG tablet Commonly known as:  ELAVIL Take 50 mg by mouth 2 (two)  times daily.   amLODipine 10 MG tablet Commonly known as:  NORVASC Take 10 mg by mouth daily. Notes to patient:  04/07/18   aspirin 81 MG tablet Take 81 mg by mouth daily.   butalbital-acetaminophen-caffeine 50-325-40 MG tablet Commonly known as:  FIORICET, ESGIC Take 1 tablet by mouth every 6 (six) hours as needed for headache.   cloNIDine 0.1 MG tablet Commonly known as:  CATAPRES Take 0.1 mg by mouth 2 (two) times daily.   DEPO-TESTOSTERONE 200 MG/ML injection Generic drug:  testosterone cypionate Inject 200 mg into the muscle every 28 (twenty-eight) days.   diphenhydrAMINE 25 MG tablet Commonly known as:  BENADRYL Take 75 mg by mouth at bedtime.   febuxostat 40 MG tablet Commonly known as:  ULORIC Take 40 mg by mouth daily.   ferrous sulfate 325 (65 FE) MG tablet Take 325 mg by mouth 3 (three) times a week.   FISH OIL PO Take 1 capsule by mouth daily. 2400 mg + DHA 630 mg   fluocinonide ointment 0.05 % Commonly known as:  LIDEX Apply 1 application topically 2 (two) times daily as needed (skin irritation).   fluticasone 50 MCG/ACT nasal spray Commonly known as:  FLONASE Place 1 spray into both nostrils at bedtime.   hydrochlorothiazide 25 MG tablet Commonly known as:  HYDRODIURIL Take 25 mg by mouth daily.   HYDROcodone-acetaminophen 5-325 MG tablet Commonly known  as:  NORCO/VICODIN Take 1 tablet by mouth every 6 (six) hours as needed (pain).   lisinopril 40 MG tablet Commonly known as:  PRINIVIL,ZESTRIL Take 40 mg by mouth 2 (two) times daily.   meloxicam 15 MG tablet Commonly known as:  MOBIC Take 15 mg by mouth daily.   methocarbamol 500 MG tablet Commonly known as:  ROBAXIN Take 1 tablet (500 mg total) by mouth 3 (three) times daily as needed.   metoprolol tartrate 50 MG tablet Commonly known as:  LOPRESSOR Take 50 mg by mouth 2 (two) times daily.   multivitamin tablet Take 1 tablet by mouth daily.   oxyCODONE-acetaminophen 7.5-325 MG  tablet Commonly known as:  PERCOCET Take 1-2 tablets by mouth every 4 (four) hours as needed for moderate pain or severe pain.   pantoprazole 40 MG tablet Commonly known as:  PROTONIX Take 40 mg by mouth daily.   potassium chloride SA 20 MEQ tablet Commonly known as:  K-DUR,KLOR-CON Take 10 mEq by mouth 3 (three) times a week.   Vitamin D (Ergocalciferol) 1.25 MG (50000 UT) Caps capsule Commonly known as:  DRISDOL Take 50,000 Units by mouth 2 (two) times a week.         Signed: Thea Gisthomas B  04/08/2018, 12:03 PM  [facility] now [facility]  [place] [address], [place], [place] [address] Phone: [phone] Facebook  Instagram  [employer]  Twitter

## 2018-05-15 DIAGNOSIS — E669 Obesity, unspecified: Secondary | ICD-10-CM | POA: Diagnosis not present

## 2018-05-15 DIAGNOSIS — I10 Essential (primary) hypertension: Secondary | ICD-10-CM | POA: Diagnosis not present

## 2018-05-15 DIAGNOSIS — Z0189 Encounter for other specified special examinations: Secondary | ICD-10-CM | POA: Diagnosis not present

## 2018-05-27 DIAGNOSIS — Z4789 Encounter for other orthopedic aftercare: Secondary | ICD-10-CM | POA: Diagnosis not present

## 2018-08-01 DIAGNOSIS — M25561 Pain in right knee: Secondary | ICD-10-CM | POA: Diagnosis not present

## 2018-10-13 DIAGNOSIS — G4733 Obstructive sleep apnea (adult) (pediatric): Secondary | ICD-10-CM | POA: Diagnosis not present

## 2018-11-18 DIAGNOSIS — E669 Obesity, unspecified: Secondary | ICD-10-CM | POA: Diagnosis not present

## 2018-11-18 DIAGNOSIS — M25561 Pain in right knee: Secondary | ICD-10-CM | POA: Diagnosis not present

## 2018-11-18 DIAGNOSIS — I10 Essential (primary) hypertension: Secondary | ICD-10-CM | POA: Diagnosis not present

## 2018-11-18 DIAGNOSIS — Z9889 Other specified postprocedural states: Secondary | ICD-10-CM | POA: Diagnosis not present

## 2019-02-20 IMAGING — DX DG SHOULDER 2+V PORT*R*
1 series · 1 of 1 positions shown · non-contrast
Comparison: Two-view chest x-ray [DATE]

CLINICAL DATA: Right shoulder replacement.

EXAM:
PORTABLE RIGHT SHOULDER

[shoulder ap]
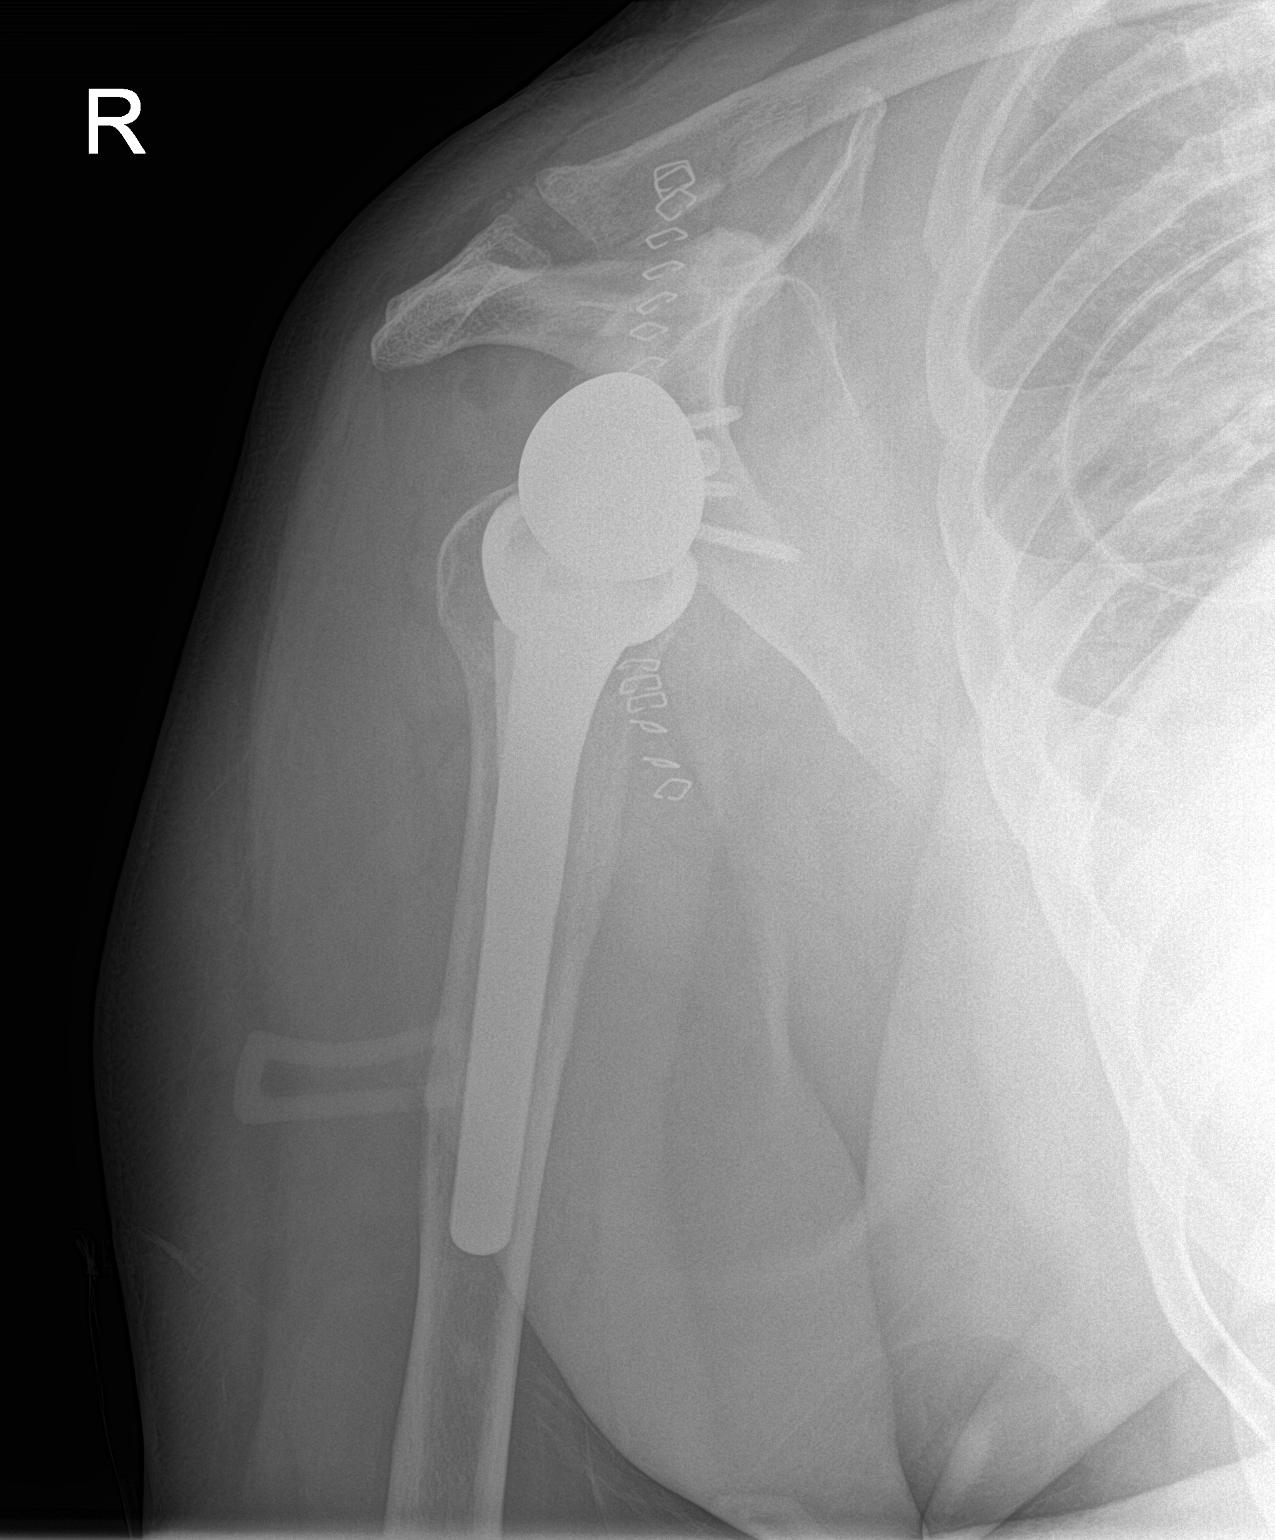

[1 of 1 positions shown; findings below may reference images not displayed]

FINDINGS: Single view of the right shoulder demonstrates right shoulder
replacement. The shoulder appears located on this single view. The
right hemithorax lung volumes are low. Visualized clavicle is within
normal limits.
IMPRESSION: Right shoulder replacement without radiographic evidence for
complication.

## 2019-06-16 DIAGNOSIS — Z0189 Encounter for other specified special examinations: Secondary | ICD-10-CM | POA: Diagnosis not present

## 2019-06-16 DIAGNOSIS — M25561 Pain in right knee: Secondary | ICD-10-CM | POA: Diagnosis not present

## 2019-10-27 DIAGNOSIS — E669 Obesity, unspecified: Secondary | ICD-10-CM | POA: Diagnosis not present

## 2019-10-27 DIAGNOSIS — M25551 Pain in right hip: Secondary | ICD-10-CM | POA: Diagnosis not present

## 2019-10-27 DIAGNOSIS — M546 Pain in thoracic spine: Secondary | ICD-10-CM | POA: Diagnosis not present

## 2019-10-27 DIAGNOSIS — M1711 Unilateral primary osteoarthritis, right knee: Secondary | ICD-10-CM | POA: Diagnosis not present

## 2019-10-27 DIAGNOSIS — M25552 Pain in left hip: Secondary | ICD-10-CM | POA: Diagnosis not present

## 2019-10-27 DIAGNOSIS — R42 Dizziness and giddiness: Secondary | ICD-10-CM | POA: Diagnosis not present

## 2019-10-27 DIAGNOSIS — R079 Chest pain, unspecified: Secondary | ICD-10-CM | POA: Diagnosis not present

## 2019-10-27 DIAGNOSIS — Z96611 Presence of right artificial shoulder joint: Secondary | ICD-10-CM | POA: Diagnosis not present

## 2019-10-27 DIAGNOSIS — I10 Essential (primary) hypertension: Secondary | ICD-10-CM | POA: Diagnosis not present

## 2019-12-03 DIAGNOSIS — R42 Dizziness and giddiness: Secondary | ICD-10-CM | POA: Diagnosis not present

## 2019-12-03 DIAGNOSIS — I779 Disorder of arteries and arterioles, unspecified: Secondary | ICD-10-CM | POA: Diagnosis not present

## 2019-12-08 DIAGNOSIS — D224 Melanocytic nevi of scalp and neck: Secondary | ICD-10-CM | POA: Diagnosis not present

## 2019-12-08 DIAGNOSIS — R238 Other skin changes: Secondary | ICD-10-CM | POA: Diagnosis not present

## 2019-12-08 DIAGNOSIS — L209 Atopic dermatitis, unspecified: Secondary | ICD-10-CM | POA: Diagnosis not present

## 2019-12-08 DIAGNOSIS — B353 Tinea pedis: Secondary | ICD-10-CM | POA: Diagnosis not present

## 2019-12-08 DIAGNOSIS — C4441 Basal cell carcinoma of skin of scalp and neck: Secondary | ICD-10-CM | POA: Diagnosis not present

## 2019-12-08 DIAGNOSIS — L820 Inflamed seborrheic keratosis: Secondary | ICD-10-CM | POA: Diagnosis not present

## 2020-01-11 DIAGNOSIS — G4733 Obstructive sleep apnea (adult) (pediatric): Secondary | ICD-10-CM | POA: Diagnosis not present

## 2020-01-20 DIAGNOSIS — G4733 Obstructive sleep apnea (adult) (pediatric): Secondary | ICD-10-CM | POA: Diagnosis not present

## 2020-03-01 ENCOUNTER — Ambulatory Visit: Payer: BC Managed Care – PPO | Admitting: Family Medicine

## 2020-03-01 ENCOUNTER — Encounter: Payer: Self-pay | Admitting: Family Medicine

## 2020-03-01 ENCOUNTER — Other Ambulatory Visit: Payer: Self-pay

## 2020-03-01 VITALS — BP 139/86 | HR 56 | Temp 97.6°F | Resp 20 | Ht 70.75 in | Wt 342.0 lb

## 2020-03-01 DIAGNOSIS — S00412A Abrasion of left ear, initial encounter: Secondary | ICD-10-CM

## 2020-03-01 MED ORDER — CIPROFLOXACIN-HYDROCORTISONE 0.2-1 % OT SUSP: 3.0000 [drp] | Freq: Two times a day (BID) | OTIC | 0 refills | Status: DC

## 2020-03-01 NOTE — Progress Notes (Signed)
No chief complaint on file.   HPI  Patient presents today for pain in the left ear.  He was washing it out and using a metal cerumen spoon at home.  He has done this many times due to chronic cerumen impactions.  However, this time he felt pain and bleeding when he inserted the spoon.  This happened 2 or 3 days ago.  He has had pain ever since.  The bleeding is intermittent and it seems like there is just a little bit of wax mixed with blood periodically now.  He denies any change in his hearing.  Patient is a disabled veteran and most of his care is given at the [facility], or at the [place] facility  PMH: Smoking status noted ROS: Per HPI  Objective: BP 139/86   Pulse (!) 56   Temp 97.6 F (36.4 C) (Temporal)   Resp 20   Ht 5' 10.75" (1.797 m)   Wt (!) 342 lb (155.1 kg)   SpO2 97%   BMI 48.04 kg/m  Gen: NAD, alert, cooperative with exam HEENT: NCAT, EOMI, PERRL.  Both TMs are clear.  The right canal is mildly edematous but free of wax.  The left ear canal is edematous and friable with some oozing of minimal amounts of blood at the base and close to the eardrum.  However I could detect no perforation. Neuro: Alert and oriented, No gross deficits  Assessment and plan:  1. Abrasion of left ear, initial encounter     Meds ordered this encounter  Medications  . ciprofloxacin-hydrocortisone (CIPRO HC OTIC) OTIC suspension    Sig: Place 3 drops into both ears 2 (two) times daily. For one week    Dispense:  10 mL    Refill:  0    No orders of the defined types were placed in this encounter.   Follow up as needed.  Mechele Claude, MD

## 2020-04-19 DIAGNOSIS — Z79891 Long term (current) use of opiate analgesic: Secondary | ICD-10-CM | POA: Diagnosis not present

## 2020-07-01 DIAGNOSIS — Z6841 Body Mass Index (BMI) 40.0 and over, adult: Secondary | ICD-10-CM | POA: Diagnosis not present

## 2020-07-01 DIAGNOSIS — Z0189 Encounter for other specified special examinations: Secondary | ICD-10-CM | POA: Diagnosis not present

## 2020-07-01 DIAGNOSIS — E669 Obesity, unspecified: Secondary | ICD-10-CM | POA: Diagnosis not present

## 2020-07-01 DIAGNOSIS — I10 Essential (primary) hypertension: Secondary | ICD-10-CM | POA: Diagnosis not present

## 2020-07-01 DIAGNOSIS — E785 Hyperlipidemia, unspecified: Secondary | ICD-10-CM | POA: Diagnosis not present

## 2020-11-01 ENCOUNTER — Telehealth: Payer: Self-pay | Admitting: Family Medicine

## 2020-11-01 NOTE — Telephone Encounter (Signed)
In person appointment scheduled.

## 2020-11-03 ENCOUNTER — Other Ambulatory Visit: Payer: Self-pay

## 2020-11-03 ENCOUNTER — Ambulatory Visit: Payer: BC Managed Care – PPO | Admitting: Family Medicine

## 2020-11-03 ENCOUNTER — Encounter: Payer: Self-pay | Admitting: Family Medicine

## 2020-11-03 VITALS — BP 152/87 | HR 61 | Temp 97.2°F | Ht 70.75 in | Wt 350.2 lb

## 2020-11-03 DIAGNOSIS — M159 Polyosteoarthritis, unspecified: Secondary | ICD-10-CM

## 2020-11-03 DIAGNOSIS — M8949 Other hypertrophic osteoarthropathy, multiple sites: Secondary | ICD-10-CM

## 2020-11-03 DIAGNOSIS — Z23 Encounter for immunization: Secondary | ICD-10-CM

## 2020-11-06 ENCOUNTER — Encounter: Payer: Self-pay | Admitting: Family Medicine

## 2020-11-06 NOTE — Progress Notes (Signed)
No chief complaint on file.   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  He has trouble bending over due to arthritis.  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  Patient has VA benefits.  He uses the [place] clinics for his health care as a rule.  We just fill in when he has an urgent need.  PMH: Smoking status noted ROS: Per HPI  Objective: BP (!) 152/87   Pulse 61   Temp (!) 97.2 F (36.2 C)   Ht 5' 10.75" (1.797 m)   Wt (!) 350 lb 3.2 oz (158.8 kg)   SpO2 95%   BMI 49.19 kg/m  Gen: NAD, alert, cooperative with exam HEENT: NCAT, EOMI, PERRL CV: RRR, good S1/S2, no murmur Resp: CTABL, no wheezes, non-labored Ext: No edema, warm decreased range of motion for hips knees and back Neuro: Alert and oriented, No gross deficits  Assessment and plan:  1. Need for shingles vaccine   2. Need for pneumococcal vaccination   3. Primary osteoarthritis involving multiple joints     No orders of the defined types were placed in this encounter.   Orders Placed This Encounter  Procedures   Varicella-zoster vaccine IM (Shingrix)   Pneumococcal conjugate vaccine 13-valent    Follow up as needed.  Mechele Claude, MD

## 2020-12-29 DIAGNOSIS — M1711 Unilateral primary osteoarthritis, right knee: Secondary | ICD-10-CM | POA: Diagnosis not present

## 2020-12-29 DIAGNOSIS — E785 Hyperlipidemia, unspecified: Secondary | ICD-10-CM | POA: Diagnosis not present

## 2020-12-29 DIAGNOSIS — E669 Obesity, unspecified: Secondary | ICD-10-CM | POA: Diagnosis not present

## 2020-12-29 DIAGNOSIS — I10 Essential (primary) hypertension: Secondary | ICD-10-CM | POA: Diagnosis not present

## 2021-04-19 DIAGNOSIS — Z0189 Encounter for other specified special examinations: Secondary | ICD-10-CM | POA: Diagnosis not present

## 2021-06-05 ENCOUNTER — Ambulatory Visit: Payer: BC Managed Care – PPO | Admitting: Family Medicine

## 2021-06-05 ENCOUNTER — Encounter: Payer: Self-pay | Admitting: Family Medicine

## 2021-06-05 VITALS — BP 161/90 | HR 60 | Temp 98.2°F | Ht 70.75 in | Wt 352.8 lb

## 2021-06-05 DIAGNOSIS — H1033 Unspecified acute conjunctivitis, bilateral: Secondary | ICD-10-CM

## 2021-06-05 DIAGNOSIS — J0100 Acute maxillary sinusitis, unspecified: Secondary | ICD-10-CM

## 2021-06-05 DIAGNOSIS — J069 Acute upper respiratory infection, unspecified: Secondary | ICD-10-CM

## 2021-06-05 LAB — VERITOR FLU A/B WAIVED
Influenza A: NEGATIVE
Influenza B: NEGATIVE

## 2021-06-05 MED ORDER — TOBRAMYCIN-DEXAMETHASONE 0.3-0.1 % OP SUSP: OPHTHALMIC | 0 refills | Status: AC

## 2021-06-05 MED ORDER — AMOXICILLIN-POT CLAVULANATE 875-125 MG PO TABS: 1.0000 | ORAL_TABLET | Freq: Two times a day (BID) | ORAL | 0 refills | Status: AC

## 2021-06-05 NOTE — Progress Notes (Signed)
Chief Complaint  Patient presents with   Cough   Nasal Congestion   Sore Throat    HPI  Patient presents today for Patient presents with upper respiratory congestion. Rhinorrhea that is frequently purulent. Eyes itching & burning with purulent DC. There is moderate sore throat. Patient reports coughing frequently as well.  alittle sputum noted. There is no fever, chills or sweats. The patient denies being short of breath. Onset was 13 days ago. Gradually worsening. Tried OTCs without improvement.  PMH: Smoking status noted ROS: Per HPI  Objective: BP (!) 161/90    Pulse 60    Temp 98.2 F (36.8 C)    Ht 5' 10.75" (1.797 m)    Wt (!) 352 lb 12.8 oz (160 kg)    SpO2 98%    BMI 49.55 kg/m  Gen: NAD, alert, cooperative with exam HEENT: NCAT, Nasal passages swollen, red Conjunctivae innjected CV: RRR, good S1/S2, no murmur Resp: Bronchitis changes with scattered wheezes, non-labored Ext: No edema, warm Neuro: Alert and oriented, No gross deficits  Assessment and plan:  1. Upper respiratory infection with cough and congestion   2. Acute maxillary sinusitis, recurrence not specified   3. Acute bacterial conjunctivitis of both eyes     Meds ordered this encounter  Medications   tobramycin-dexamethasone (TOBRADEX) ophthalmic solution    Sig: Apply 1 drop in affected eye(s) every 2 hours for two days. Then every 4 hours for 5 days.    Dispense:  5 mL    Refill:  0   amoxicillin-clavulanate (AUGMENTIN) 875-125 MG tablet    Sig: Take 1 tablet by mouth 2 (two) times daily. Take all of this medication    Dispense:  20 tablet    Refill:  0    Orders Placed This Encounter  Procedures   Novel Coronavirus, NAA ([facility])    Order Specific Question:   Previously tested for COVID-19    Answer:   No    Order Specific Question:   Resident in a congregate (group) care setting    Answer:   No    Order Specific Question:   Is the patient student?    Answer:   No    Order Specific Question:    Employed in healthcare setting    Answer:   No    Order Specific Question:   Has patient completed COVID vaccination(s) (2 doses of Pfizer/Moderna 1 dose of [employer])    Answer:   Unknown   Veritor Flu A/B Waived    Order Specific Question:   Source    Answer:   nasopharangeal    Follow up as needed.  Mechele Claude, MD

## 2021-06-06 LAB — SARS-COV-2, NAA 2 DAY TAT

## 2021-06-06 LAB — NOVEL CORONAVIRUS, NAA: SARS-CoV-2, NAA: NOT DETECTED

## 2021-06-23 DIAGNOSIS — M545 Low back pain, unspecified: Secondary | ICD-10-CM | POA: Diagnosis not present

## 2021-06-23 DIAGNOSIS — E785 Hyperlipidemia, unspecified: Secondary | ICD-10-CM | POA: Diagnosis not present

## 2021-07-26 DIAGNOSIS — M65342 Trigger finger, left ring finger: Secondary | ICD-10-CM | POA: Diagnosis not present

## 2021-09-25 DIAGNOSIS — M65842 Other synovitis and tenosynovitis, left hand: Secondary | ICD-10-CM | POA: Diagnosis not present

## 2021-09-25 DIAGNOSIS — M65342 Trigger finger, left ring finger: Secondary | ICD-10-CM | POA: Diagnosis not present

## 2021-10-27 DIAGNOSIS — M65342 Trigger finger, left ring finger: Secondary | ICD-10-CM | POA: Diagnosis not present

## 2021-10-27 DIAGNOSIS — Z79899 Other long term (current) drug therapy: Secondary | ICD-10-CM | POA: Diagnosis not present

## 2021-11-13 DIAGNOSIS — K136 Irritative hyperplasia of oral mucosa: Secondary | ICD-10-CM | POA: Diagnosis not present

## 2021-11-16 DIAGNOSIS — H903 Sensorineural hearing loss, bilateral: Secondary | ICD-10-CM | POA: Diagnosis not present

## 2021-11-16 DIAGNOSIS — H9313 Tinnitus, bilateral: Secondary | ICD-10-CM | POA: Diagnosis not present

## 2021-11-20 DIAGNOSIS — I10 Essential (primary) hypertension: Secondary | ICD-10-CM | POA: Diagnosis not present

## 2021-11-20 DIAGNOSIS — R0602 Shortness of breath: Secondary | ICD-10-CM | POA: Diagnosis not present

## 2021-11-20 DIAGNOSIS — Z1331 Encounter for screening for depression: Secondary | ICD-10-CM | POA: Diagnosis not present

## 2021-11-20 DIAGNOSIS — K219 Gastro-esophageal reflux disease without esophagitis: Secondary | ICD-10-CM | POA: Diagnosis not present

## 2021-11-20 DIAGNOSIS — G4733 Obstructive sleep apnea (adult) (pediatric): Secondary | ICD-10-CM | POA: Diagnosis not present

## 2021-11-20 DIAGNOSIS — R5383 Other fatigue: Secondary | ICD-10-CM | POA: Diagnosis not present

## 2021-12-07 ENCOUNTER — Other Ambulatory Visit: Payer: Self-pay | Admitting: Pain Medicine

## 2021-12-07 DIAGNOSIS — M5416 Radiculopathy, lumbar region: Secondary | ICD-10-CM

## 2021-12-12 DIAGNOSIS — K760 Fatty (change of) liver, not elsewhere classified: Secondary | ICD-10-CM | POA: Diagnosis not present

## 2021-12-12 DIAGNOSIS — I10 Essential (primary) hypertension: Secondary | ICD-10-CM | POA: Diagnosis not present

## 2021-12-12 DIAGNOSIS — G4733 Obstructive sleep apnea (adult) (pediatric): Secondary | ICD-10-CM | POA: Diagnosis not present

## 2021-12-12 DIAGNOSIS — E8881 Metabolic syndrome: Secondary | ICD-10-CM | POA: Diagnosis not present

## 2021-12-14 DIAGNOSIS — Z461 Encounter for fitting and adjustment of hearing aid: Secondary | ICD-10-CM | POA: Diagnosis not present

## 2021-12-14 DIAGNOSIS — H903 Sensorineural hearing loss, bilateral: Secondary | ICD-10-CM | POA: Diagnosis not present

## 2021-12-21 ENCOUNTER — Ambulatory Visit
Admission: RE | Admit: 2021-12-21 | Discharge: 2021-12-21 | Disposition: A | Discharge status: Home / Self Care | Payer: Non-veteran care | Source: Ambulatory Visit | Attending: Pain Medicine | Admitting: Pain Medicine

## 2021-12-21 DIAGNOSIS — G4733 Obstructive sleep apnea (adult) (pediatric): Secondary | ICD-10-CM | POA: Diagnosis not present

## 2021-12-21 DIAGNOSIS — M5416 Radiculopathy, lumbar region: Secondary | ICD-10-CM

## 2022-01-01 DIAGNOSIS — G4733 Obstructive sleep apnea (adult) (pediatric): Secondary | ICD-10-CM | POA: Diagnosis not present

## 2022-01-11 DIAGNOSIS — E559 Vitamin D deficiency, unspecified: Secondary | ICD-10-CM | POA: Diagnosis not present

## 2022-01-11 DIAGNOSIS — E8881 Metabolic syndrome: Secondary | ICD-10-CM | POA: Diagnosis not present

## 2022-01-31 DIAGNOSIS — I10 Essential (primary) hypertension: Secondary | ICD-10-CM | POA: Diagnosis not present

## 2022-01-31 DIAGNOSIS — E291 Testicular hypofunction: Secondary | ICD-10-CM | POA: Diagnosis not present

## 2022-02-14 DIAGNOSIS — Z6841 Body Mass Index (BMI) 40.0 and over, adult: Secondary | ICD-10-CM | POA: Diagnosis not present

## 2022-02-14 DIAGNOSIS — H6691 Otitis media, unspecified, right ear: Secondary | ICD-10-CM | POA: Diagnosis not present

## 2022-02-14 DIAGNOSIS — G4733 Obstructive sleep apnea (adult) (pediatric): Secondary | ICD-10-CM | POA: Diagnosis not present

## 2022-02-14 DIAGNOSIS — Z7985 Long-term (current) use of injectable non-insulin antidiabetic drugs: Secondary | ICD-10-CM | POA: Diagnosis not present

## 2022-03-02 DIAGNOSIS — Z79891 Long term (current) use of opiate analgesic: Secondary | ICD-10-CM | POA: Diagnosis not present

## 2022-03-02 DIAGNOSIS — G894 Chronic pain syndrome: Secondary | ICD-10-CM | POA: Diagnosis not present

## 2022-03-02 DIAGNOSIS — M25562 Pain in left knee: Secondary | ICD-10-CM | POA: Diagnosis not present

## 2022-03-02 DIAGNOSIS — M5442 Lumbago with sciatica, left side: Secondary | ICD-10-CM | POA: Diagnosis not present

## 2022-03-19 DIAGNOSIS — Z6841 Body Mass Index (BMI) 40.0 and over, adult: Secondary | ICD-10-CM | POA: Diagnosis not present

## 2022-03-19 DIAGNOSIS — Z7985 Long-term (current) use of injectable non-insulin antidiabetic drugs: Secondary | ICD-10-CM | POA: Diagnosis not present

## 2022-03-27 DIAGNOSIS — Z6841 Body Mass Index (BMI) 40.0 and over, adult: Secondary | ICD-10-CM | POA: Diagnosis not present

## 2022-03-27 DIAGNOSIS — M25562 Pain in left knee: Secondary | ICD-10-CM | POA: Diagnosis not present

## 2022-03-27 DIAGNOSIS — I10 Essential (primary) hypertension: Secondary | ICD-10-CM | POA: Diagnosis not present

## 2022-03-27 DIAGNOSIS — E65 Localized adiposity: Secondary | ICD-10-CM | POA: Diagnosis not present

## 2022-05-30 DIAGNOSIS — I10 Essential (primary) hypertension: Secondary | ICD-10-CM | POA: Diagnosis not present

## 2022-05-30 DIAGNOSIS — K2100 Gastro-esophageal reflux disease with esophagitis, without bleeding: Secondary | ICD-10-CM | POA: Diagnosis not present

## 2022-05-30 DIAGNOSIS — E785 Hyperlipidemia, unspecified: Secondary | ICD-10-CM | POA: Diagnosis not present

## 2022-08-21 DIAGNOSIS — I10 Essential (primary) hypertension: Secondary | ICD-10-CM | POA: Diagnosis not present

## 2022-08-21 DIAGNOSIS — G4733 Obstructive sleep apnea (adult) (pediatric): Secondary | ICD-10-CM | POA: Diagnosis not present

## 2022-08-21 DIAGNOSIS — Z6841 Body Mass Index (BMI) 40.0 and over, adult: Secondary | ICD-10-CM | POA: Diagnosis not present

## 2022-09-27 DIAGNOSIS — I10 Essential (primary) hypertension: Secondary | ICD-10-CM | POA: Diagnosis not present

## 2022-09-27 DIAGNOSIS — G894 Chronic pain syndrome: Secondary | ICD-10-CM | POA: Diagnosis not present

## 2022-09-27 DIAGNOSIS — G4733 Obstructive sleep apnea (adult) (pediatric): Secondary | ICD-10-CM | POA: Diagnosis not present

## 2022-09-27 DIAGNOSIS — Z79891 Long term (current) use of opiate analgesic: Secondary | ICD-10-CM | POA: Diagnosis not present

## 2022-09-27 DIAGNOSIS — E668 Other obesity: Secondary | ICD-10-CM | POA: Diagnosis not present

## 2022-09-27 DIAGNOSIS — N390 Urinary tract infection, site not specified: Secondary | ICD-10-CM | POA: Diagnosis not present

## 2022-09-27 DIAGNOSIS — Z6841 Body Mass Index (BMI) 40.0 and over, adult: Secondary | ICD-10-CM | POA: Diagnosis not present

## 2022-09-27 DIAGNOSIS — M25569 Pain in unspecified knee: Secondary | ICD-10-CM | POA: Diagnosis not present

## 2022-09-27 DIAGNOSIS — Z0189 Encounter for other specified special examinations: Secondary | ICD-10-CM | POA: Diagnosis not present

## 2022-09-27 DIAGNOSIS — G8929 Other chronic pain: Secondary | ICD-10-CM | POA: Diagnosis not present

## 2022-11-13 DIAGNOSIS — I10 Essential (primary) hypertension: Secondary | ICD-10-CM | POA: Diagnosis not present

## 2022-11-13 DIAGNOSIS — Z6841 Body Mass Index (BMI) 40.0 and over, adult: Secondary | ICD-10-CM | POA: Diagnosis not present

## 2022-11-13 DIAGNOSIS — G4733 Obstructive sleep apnea (adult) (pediatric): Secondary | ICD-10-CM | POA: Diagnosis not present

## 2022-12-31 DIAGNOSIS — D485 Neoplasm of uncertain behavior of skin: Secondary | ICD-10-CM | POA: Diagnosis not present

## 2023-01-14 DIAGNOSIS — Z79891 Long term (current) use of opiate analgesic: Secondary | ICD-10-CM | POA: Diagnosis not present

## 2023-01-14 DIAGNOSIS — G894 Chronic pain syndrome: Secondary | ICD-10-CM | POA: Diagnosis not present

## 2023-01-29 DIAGNOSIS — R531 Weakness: Secondary | ICD-10-CM | POA: Diagnosis not present

## 2023-01-29 DIAGNOSIS — M25561 Pain in right knee: Secondary | ICD-10-CM | POA: Diagnosis not present

## 2023-01-29 DIAGNOSIS — M25551 Pain in right hip: Secondary | ICD-10-CM | POA: Diagnosis not present

## 2023-01-29 DIAGNOSIS — R2681 Unsteadiness on feet: Secondary | ICD-10-CM | POA: Diagnosis not present

## 2023-01-30 DIAGNOSIS — D225 Melanocytic nevi of trunk: Secondary | ICD-10-CM | POA: Diagnosis not present

## 2023-01-30 DIAGNOSIS — D485 Neoplasm of uncertain behavior of skin: Secondary | ICD-10-CM | POA: Diagnosis not present

## 2023-02-06 DIAGNOSIS — M545 Low back pain, unspecified: Secondary | ICD-10-CM | POA: Diagnosis not present

## 2023-02-06 DIAGNOSIS — M25561 Pain in right knee: Secondary | ICD-10-CM | POA: Diagnosis not present

## 2023-02-06 DIAGNOSIS — G8929 Other chronic pain: Secondary | ICD-10-CM | POA: Diagnosis not present

## 2023-02-07 DIAGNOSIS — M4807 Spinal stenosis, lumbosacral region: Secondary | ICD-10-CM | POA: Diagnosis not present

## 2023-02-07 DIAGNOSIS — G8911 Acute pain due to trauma: Secondary | ICD-10-CM | POA: Diagnosis not present

## 2023-02-19 DIAGNOSIS — Z7985 Long-term (current) use of injectable non-insulin antidiabetic drugs: Secondary | ICD-10-CM | POA: Diagnosis not present

## 2023-02-19 DIAGNOSIS — M25561 Pain in right knee: Secondary | ICD-10-CM | POA: Diagnosis not present

## 2023-02-19 DIAGNOSIS — Z6841 Body Mass Index (BMI) 40.0 and over, adult: Secondary | ICD-10-CM | POA: Diagnosis not present

## 2023-02-19 DIAGNOSIS — I10 Essential (primary) hypertension: Secondary | ICD-10-CM | POA: Diagnosis not present

## 2023-03-01 ENCOUNTER — Other Ambulatory Visit: Payer: Self-pay | Admitting: Family Medicine

## 2023-03-01 DIAGNOSIS — Z1211 Encounter for screening for malignant neoplasm of colon: Secondary | ICD-10-CM

## 2023-03-01 DIAGNOSIS — Z1212 Encounter for screening for malignant neoplasm of rectum: Secondary | ICD-10-CM

## 2023-03-06 DIAGNOSIS — Z5181 Encounter for therapeutic drug level monitoring: Secondary | ICD-10-CM | POA: Diagnosis not present

## 2023-03-06 DIAGNOSIS — M1711 Unilateral primary osteoarthritis, right knee: Secondary | ICD-10-CM | POA: Diagnosis not present

## 2023-03-06 DIAGNOSIS — X58XXXA Exposure to other specified factors, initial encounter: Secondary | ICD-10-CM | POA: Diagnosis not present

## 2023-03-06 DIAGNOSIS — S83241A Other tear of medial meniscus, current injury, right knee, initial encounter: Secondary | ICD-10-CM | POA: Diagnosis not present

## 2023-03-06 DIAGNOSIS — Z79891 Long term (current) use of opiate analgesic: Secondary | ICD-10-CM | POA: Diagnosis not present

## 2023-03-16 LAB — COLOGUARD: COLOGUARD: NEGATIVE

## 2023-04-05 DIAGNOSIS — M549 Dorsalgia, unspecified: Secondary | ICD-10-CM | POA: Diagnosis not present

## 2023-04-05 DIAGNOSIS — D4989 Neoplasm of unspecified behavior of other specified sites: Secondary | ICD-10-CM | POA: Diagnosis not present

## 2023-04-05 DIAGNOSIS — G8929 Other chronic pain: Secondary | ICD-10-CM | POA: Diagnosis not present

## 2023-04-05 DIAGNOSIS — M25569 Pain in unspecified knee: Secondary | ICD-10-CM | POA: Diagnosis not present

## 2023-04-05 DIAGNOSIS — Z1389 Encounter for screening for other disorder: Secondary | ICD-10-CM | POA: Diagnosis not present

## 2023-04-09 DIAGNOSIS — R238 Other skin changes: Secondary | ICD-10-CM | POA: Diagnosis not present

## 2023-04-22 ENCOUNTER — Other Ambulatory Visit: Payer: Self-pay

## 2023-04-22 ENCOUNTER — Ambulatory Visit: Payer: No Typology Code available for payment source | Attending: Orthopedic Surgery

## 2023-04-22 DIAGNOSIS — M6281 Muscle weakness (generalized): Secondary | ICD-10-CM | POA: Diagnosis present

## 2023-04-22 DIAGNOSIS — R262 Difficulty in walking, not elsewhere classified: Secondary | ICD-10-CM | POA: Insufficient documentation

## 2023-04-22 DIAGNOSIS — M25561 Pain in right knee: Secondary | ICD-10-CM | POA: Insufficient documentation

## 2023-04-22 NOTE — Therapy (Signed)
OUTPATIENT PHYSICAL THERAPY LOWER EXTREMITY EVALUATION   Patient Name: Joe Vance MRN: [id number] DOB:1951-02-05, 72 y.o., male Today's Date: 04/22/2023  END OF SESSION:  PT End of Session - 04/22/23 1349     Visit Number 1    Number of Visits 6    Date for PT Re-Evaluation 06/07/23    PT Start Time 1350    PT Stop Time 1435    PT Time Calculation (min) 45 min    Activity Tolerance Patient tolerated treatment well    Behavior During Therapy WFL for tasks assessed/performed             Past Medical History:  Diagnosis Date   BPH (benign prostatic hyperplasia)    Complication of anesthesia    GERD (gastroesophageal reflux disease)    HTN (hypertension)    Hyperlipidemia    Migraines    Morbid obesity (HCC)    Osteoarthritis    Osteoarthritis of shoulder    End stage- Right   PONV (postoperative nausea and vomiting)    Sleep apnea    Cpap   Past Surgical History:  Procedure Laterality Date   CHOLECYSTECTOMY     INGUINAL HERNIA REPAIR     x 2   KNEE SURGERY     x 2   REVERSE SHOULDER ARTHROPLASTY Right 04/04/2018   Procedure: REVERSE SHOULDER ARTHROPLASTY;  Surgeon: Beverely Low, MD;  Location: [facility] OR;  Service: Orthopedics;  Laterality: Right;   Patient Active Problem List   Diagnosis Date Noted   S/P shoulder replacement, right 04/04/2018   Low back pain 07/23/2017   Myalgia 07/23/2017   Inflammatory polyarthropathy (HCC) 07/23/2017   LBBB (left bundle branch block) 08/08/2011   Morbid obesity (HCC)    HTN (hypertension)    Hyperlipidemia    REFERRING PROVIDER: Grafton Folk, MD   REFERRING DIAG: Patellar tendinitis, right knee   THERAPY DIAG:  Acute pain of right knee  Muscle weakness (generalized)  Difficulty in walking, not elsewhere classified  Rationale for Evaluation and Treatment: Rehabilitation  ONSET DATE: 02/06/23  SUBJECTIVE:   SUBJECTIVE STATEMENT: Patient reports that his right knee has been bothering him since 02/06/23.  He notes that he has had problems with his left knee for years as he has been wearing a brace on this knee for years. He feels that his right knee has begun to hurt due to him having to compensate for the left knee. He also had sciatic pain on his right side that began around this same time. He had to go to the emergency room due to the pain being so severe. However, he feels that has begun to improve slightly. He still has trouble getting around because he is afraid that the pain will stop him. He has been using bilateral axiliary crutches for years to walk long distances. He feels like his right knee will want to buckle on him, but it has never caused him to fall.   PERTINENT HISTORY: Hypertension, obesity, chronic low back pain, and osteoarthritis PAIN:  Are you having pain? Yes: NPRS scale: 9-10/10 Pain location: anterior right knee  Pain description: aching, throbbing, with intermittent sharp pain Aggravating factors: walking, using the restroom, and transfers  Relieving factors: sitting, lidocaine patches  PRECAUTIONS: None  RED FLAGS: None   WEIGHT BEARING RESTRICTIONS: No  FALLS:  Has patient fallen in last 6 months? 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  PATIENT SURVEYS:  FOTO 25.03  COGNITION: Overall cognitive status: Within functional limits for tasks  assessed     SENSATION: Patient reports no numbness or tingling  PALPATION: TTP: right patella, patellar tendon, quadriceps tendon, hamstring, and quadriceps  LOWER EXTREMITY ROM: assessed in sitting  Active ROM Right eval Left eval; wearing knee brace  Hip flexion    Hip extension    Hip abduction    Hip adduction    Hip internal rotation    Hip external rotation    Knee flexion 103 87  Knee extension 10 10  Ankle dorsiflexion    Ankle plantarflexion    Ankle inversion    Ankle eversion     (Blank rows = not tested)  LOWER EXTREMITY MMT:  MMT Right eval Left eval  Hip flexion 4/5 4+/5  Hip extension    Hip abduction    Hip adduction    Hip internal rotation    Hip external rotation    Knee flexion 4-/5 4-/5  Knee extension 4-/5 4/5  Ankle dorsiflexion 4/5 4/5  Ankle plantarflexion    Ankle inversion    Ankle eversion     (Blank rows = not tested)  GAIT: Assistive device utilized: Crutches Level of assistance: Modified independence Comments: wide base of support, knee flexion in stance phase bilaterally, poor foot clearance bilaterally, decreased stride length, and gait speed   TODAY'S TREATMENT:                                                                                                                              DATE:     PATIENT EDUCATION:  Education details: plan of care, prognosis, healing, and goals for therapy Person educated: Patient Education method: Explanation Education comprehension: verbalized understanding  HOME EXERCISE PROGRAM:   ASSESSMENT:  CLINICAL IMPRESSION: Patient is a 72 y.o. male who was seen today for physical therapy evaluation and treatment for right knee pain. He presented with high pain severity and moderate irritability with palpation to his right knee and the surrounding musculature being the most painful. He also exhibited reduced lower extremity musculature strength and gait deviation. Recommend that he  continue with skilled physical therapy to address his remaining impairments to maximize his safety and functional mobility.   OBJECTIVE IMPAIRMENTS: Abnormal gait, decreased activity tolerance, decreased mobility, difficulty walking, decreased ROM, decreased strength, hypomobility, impaired tone, and pain.   ACTIVITY LIMITATIONS: carrying, lifting, standing, squatting, stairs, transfers,  bathing, toileting, dressing, and locomotion level  PARTICIPATION LIMITATIONS: meal prep, cleaning, laundry, driving, shopping, community activity, occupation, and yard work  PERSONAL FACTORS: Past/current experiences, Time since onset of injury/illness/exacerbation, and 3+ comorbidities: Hypertension, obesity, chronic low back pain, and osteoarthritis  are also affecting patient's functional outcome.   REHAB POTENTIAL: Good  CLINICAL DECISION MAKING: Evolving/moderate complexity  EVALUATION COMPLEXITY: Moderate   GOALS: Goals reviewed with patient? Yes  LONG TERM GOALS: Target date: 06/02/22  Patient will be independent with his HEP.  Baseline:  Goal status: INITIAL  2.  Patient will be able to complete his daily activities without his familiar symptoms exceeding 7/10. Baseline:  Goal status: INITIAL  3.  Patient will report being able to drive for at least 1 hour without being limited by his familiar symptoms to return to his job.  Baseline:  Goal status: INITIAL  4.  Patient will report being able to stand and walk for at least 20 minutes for improved function with his daily activities.  Baseline:  Goal status: INITIAL  5.  Patient will improve his active right knee extension to within 5 degrees of neutral for improved gait mechanics.  Baseline:  Goal status: INITIAL  PLAN:  PT FREQUENCY: 1x/week  PT DURATION: 6 weeks  PLANNED INTERVENTIONS: 97164- PT Re-evaluation, 97110-Therapeutic exercises, 97530- Therapeutic activity, 97112- Neuromuscular re-education, 97535- Self Care, 62130-  Manual therapy, [phone]- Gait training, 97014- Electrical stimulation (unattended), 97016- Vasopneumatic device, Patient/Family education, Balance training, Stair training, Taping, Joint mobilization, Cryotherapy, and Moist heat  PLAN FOR NEXT SESSION: Nustep, lower extremity isometrics, manual therapy, and modalities as needed   Granville Lewis, PT 04/22/2023, 4:35 PM

## 2023-04-30 ENCOUNTER — Ambulatory Visit: Payer: No Typology Code available for payment source | Attending: Orthopedic Surgery

## 2023-04-30 DIAGNOSIS — M6281 Muscle weakness (generalized): Secondary | ICD-10-CM | POA: Insufficient documentation

## 2023-04-30 DIAGNOSIS — R262 Difficulty in walking, not elsewhere classified: Secondary | ICD-10-CM | POA: Diagnosis present

## 2023-04-30 DIAGNOSIS — M25561 Pain in right knee: Secondary | ICD-10-CM | POA: Diagnosis present

## 2023-04-30 NOTE — Therapy (Signed)
OUTPATIENT PHYSICAL THERAPY LOWER EXTREMITY TREATMENT   Patient Name: Joe Vance MRN: [id number] DOB:April 07, 1951, 72 y.o., male Today's Date: 04/30/2023  END OF SESSION:  PT End of Session - 04/30/23 1452     Visit Number 2    Number of Visits 6    Date for PT Re-Evaluation 06/07/23    PT Start Time 1345    PT Stop Time 1430    PT Time Calculation (min) 45 min    Activity Tolerance Patient tolerated treatment well    Behavior During Therapy WFL for tasks assessed/performed              Past Medical History:  Diagnosis Date   BPH (benign prostatic hyperplasia)    Complication of anesthesia    GERD (gastroesophageal reflux disease)    HTN (hypertension)    Hyperlipidemia    Migraines    Morbid obesity (HCC)    Osteoarthritis    Osteoarthritis of shoulder    End stage- Right   PONV (postoperative nausea and vomiting)    Sleep apnea    Cpap   Past Surgical History:  Procedure Laterality Date   CHOLECYSTECTOMY     INGUINAL HERNIA REPAIR     x 2   KNEE SURGERY     x 2   REVERSE SHOULDER ARTHROPLASTY Right 04/04/2018   Procedure: REVERSE SHOULDER ARTHROPLASTY;  Surgeon: Beverely Low, MD;  Location: [facility] OR;  Service: Orthopedics;  Laterality: Right;   Patient Active Problem List   Diagnosis Date Noted   S/P shoulder replacement, right 04/04/2018   Low back pain 07/23/2017   Myalgia 07/23/2017   Inflammatory polyarthropathy (HCC) 07/23/2017   LBBB (left bundle branch block) 08/08/2011   Morbid obesity (HCC)    HTN (hypertension)    Hyperlipidemia    REFERRING PROVIDER: Grafton Folk, MD   REFERRING DIAG: Patellar tendinitis, right knee   THERAPY DIAG:  Acute pain of right knee  Muscle weakness (generalized)  Difficulty in walking, not elsewhere classified  Rationale for Evaluation and Treatment: Rehabilitation  ONSET DATE: 02/06/23  SUBJECTIVE:   SUBJECTIVE STATEMENT: Patient reported that his knee was hurting a little as he arrived at his  appointment too early and he had to sit for a long period. However, it felt better as he started moving.   PERTINENT HISTORY: Hypertension, obesity, chronic low back pain, and osteoarthritis PAIN:  Are you having pain? Yes: NPRS scale: 2/10 Pain location: anterior right knee  Pain description: aching, throbbing, with intermittent sharp pain Aggravating factors: walking, using the restroom, and transfers  Relieving factors: sitting, lidocaine patches  PRECAUTIONS: None  RED FLAGS: None   WEIGHT BEARING RESTRICTIONS: No  FALLS:  Has patient fallen in last 6 months? 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  PATIENT SURVEYS:  FOTO 25.03  COGNITION: Overall cognitive status: Within functional limits for tasks assessed     SENSATION: Patient  reports no numbness or tingling  PALPATION: TTP: right patella, patellar tendon, quadriceps tendon, hamstring, and quadriceps  LOWER EXTREMITY ROM: assessed in sitting  Active ROM Right eval Left eval; wearing knee brace  Hip flexion    Hip extension    Hip abduction    Hip adduction    Hip internal rotation    Hip external rotation    Knee flexion 103 87  Knee extension 10 10  Ankle dorsiflexion    Ankle plantarflexion    Ankle inversion    Ankle eversion     (Blank rows = not tested)  LOWER EXTREMITY MMT:  MMT  Right eval Left eval  Hip flexion 4/5 4+/5  Hip extension    Hip abduction    Hip adduction    Hip internal rotation    Hip external rotation    Knee flexion 4-/5 4-/5  Knee extension 4-/5 4/5  Ankle dorsiflexion 4/5 4/5  Ankle plantarflexion    Ankle inversion    Ankle eversion     (Blank rows = not tested)  GAIT: Assistive device utilized: Crutches Level of assistance: Modified independence Comments: wide base of support, knee flexion in stance phase bilaterally, poor foot clearance bilaterally, decreased stride length, and gait speed   TODAY'S TREATMENT:                                                                                                                              DATE:                                     04/30/23 EXERCISE LOG  Exercise Repetitions and Resistance Comments  Nustep  L1 x 5 minutes Limited by left knee crepitus  Seated hip ADD isometric  20 reps w/ 5 second hold   Rocker board (seated)  4.5 minutes   Seated HS isometric  2.5 minutes w/ 5 second hold    Seated clams 2 minutes   LAQ 3 minutes  RLE only  Seated heel slide  2.5 minutes  RLE only   Blank cell = exercise not performed today   PATIENT EDUCATION:  Education details: HEP, activity modification for transfers (sit to stand and car)  Person educated: Patient Education method: Explanation Education comprehension: verbalized understanding  HOME EXERCISE PROGRAM: [id number]  ASSESSMENT:  CLINICAL IMPRESSION: Patient was introduced to multiple new interventions for improved lower extremity mobility and strength. He required minimal cueing with today's new interventions with proper exercise performance. Muscular fatigue was his primary limitation as he required brief rest breaks throughout treatment. He was provided a home exercise program of today's interventions. He reported feeling comfortable performing these interventions at home. He reported feeling tired upon the conclusion of  treatment. He continues to require skilled physical therapy to address his remaining impairments to maximize his functional mobility.   OBJECTIVE IMPAIRMENTS: Abnormal gait, decreased activity  tolerance, decreased mobility, difficulty walking, decreased ROM, decreased strength, hypomobility, impaired tone, and pain.   ACTIVITY LIMITATIONS: carrying, lifting, standing, squatting, stairs, transfers, bathing, toileting, dressing, and locomotion level  PARTICIPATION LIMITATIONS: meal prep, cleaning, laundry, driving, shopping, community activity, occupation, and yard work  PERSONAL FACTORS: Past/current experiences, Time since onset of injury/illness/exacerbation, and 3+ comorbidities: Hypertension, obesity, chronic low back pain, and osteoarthritis  are also affecting patient's functional outcome.   REHAB POTENTIAL: Good  CLINICAL DECISION MAKING: Evolving/moderate complexity  EVALUATION COMPLEXITY: Moderate   GOALS: Goals reviewed with patient? Yes  LONG TERM GOALS: Target date: 06/02/22  Patient will be independent with his HEP.  Baseline:  Goal status: INITIAL  2.  Patient will be able to complete his daily activities without his familiar symptoms exceeding 7/10. Baseline:  Goal status: INITIAL  3.  Patient will report being able to drive for at least 1 hour without being limited by his familiar symptoms to return to his job.  Baseline:  Goal status: INITIAL  4.  Patient will report being able to stand and walk for at least 20 minutes for improved function with his daily activities.  Baseline:  Goal status: INITIAL  5.  Patient will improve his active right knee extension to within 5 degrees of neutral for improved gait mechanics.  Baseline:  Goal status: INITIAL  PLAN:  PT FREQUENCY: 1x/week  PT DURATION: 6 weeks  PLANNED INTERVENTIONS: 97164- PT Re-evaluation, 97110-Therapeutic exercises, 97530- Therapeutic activity, 97112- Neuromuscular re-education, 97535- Self Care,  97140- Manual therapy, [phone]- Gait training, 60454- Electrical stimulation (unattended), 97016- Vasopneumatic device, Patient/Family education, Balance training, Stair training, Taping, Joint mobilization, Cryotherapy, and Moist heat  PLAN FOR NEXT SESSION: Nustep, lower extremity isometrics, manual therapy, and modalities as needed   Granville Lewis, PT 04/30/2023, 2:53 PM

## 2023-05-08 ENCOUNTER — Ambulatory Visit: Payer: No Typology Code available for payment source

## 2023-05-08 DIAGNOSIS — M6281 Muscle weakness (generalized): Secondary | ICD-10-CM

## 2023-05-08 DIAGNOSIS — M25561 Pain in right knee: Secondary | ICD-10-CM | POA: Diagnosis not present

## 2023-05-08 DIAGNOSIS — R262 Difficulty in walking, not elsewhere classified: Secondary | ICD-10-CM

## 2023-05-08 NOTE — Therapy (Signed)
OUTPATIENT PHYSICAL THERAPY LOWER EXTREMITY TREATMENT   Patient Name: Joe Vance MRN: [id number] DOB:[birth year], 72 y.o., male Today's Date: 05/08/2023  END OF SESSION:  PT End of Session - 05/08/23 1420     Visit Number 3    Number of Visits 6    Date for PT Re-Evaluation 06/07/23    PT Start Time 1430    PT Stop Time 1513    PT Time Calculation (min) 43 min    Activity Tolerance Patient tolerated treatment well    Behavior During Therapy WFL for tasks assessed/performed               Past Medical History:  Diagnosis Date   BPH (benign prostatic hyperplasia)    Complication of anesthesia    GERD (gastroesophageal reflux disease)    HTN (hypertension)    Hyperlipidemia    Migraines    Morbid obesity (HCC)    Osteoarthritis    Osteoarthritis of shoulder    End stage- Right   PONV (postoperative nausea and vomiting)    Sleep apnea    Cpap   Past Surgical History:  Procedure Laterality Date   CHOLECYSTECTOMY     INGUINAL HERNIA REPAIR     x 2   KNEE SURGERY     x 2   REVERSE SHOULDER ARTHROPLASTY Right 04/04/2018   Procedure: REVERSE SHOULDER ARTHROPLASTY;  Surgeon: Beverely Low, MD;  Location: [facility] OR;  Service: Orthopedics;  Laterality: Right;   Patient Active Problem List   Diagnosis Date Noted   S/P shoulder replacement, right 04/04/2018   Low back pain 07/23/2017   Myalgia 07/23/2017   Inflammatory polyarthropathy (HCC) 07/23/2017   LBBB (left bundle branch block) 08/08/2011   Morbid obesity (HCC)    HTN (hypertension)    Hyperlipidemia    REFERRING PROVIDER: Grafton Folk, MD   REFERRING DIAG: Patellar tendinitis, right knee   THERAPY DIAG:  Acute pain of right knee  Muscle weakness (generalized)  Difficulty in walking, not elsewhere classified  Rationale for Evaluation and Treatment: Rehabilitation  ONSET DATE: 02/06/23  SUBJECTIVE:   SUBJECTIVE STATEMENT: Patient reports that he was really sore for about 2 days after his  last appointment. He notes that he could hardly walk the next day as he had to shuffle around.   PERTINENT HISTORY: Hypertension, obesity, chronic low back pain, and osteoarthritis PAIN:  Are you having pain? Yes: NPRS scale: 3/10 Pain location: anterior right knee  Pain description: aching, throbbing, with intermittent sharp pain Aggravating factors: walking, using the restroom, and transfers  Relieving factors: sitting, lidocaine patches  PRECAUTIONS: None  RED FLAGS: None   WEIGHT BEARING RESTRICTIONS: No  FALLS:  Has patient fallen in last 6 months? 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  PATIENT SURVEYS:  FOTO 25.03  COGNITION: Overall cognitive status: Within functional limits for tasks assessed     SENSATION: Patient reports no  numbness or tingling  PALPATION: TTP: right patella, patellar tendon, quadriceps tendon, hamstring, and quadriceps  LOWER EXTREMITY ROM: assessed in sitting  Active ROM Right eval Left eval; wearing knee brace  Hip flexion    Hip extension    Hip abduction    Hip adduction    Hip internal rotation    Hip external rotation    Knee flexion 103 87  Knee extension 10 10  Ankle dorsiflexion    Ankle plantarflexion    Ankle inversion    Ankle eversion     (Blank rows = not tested)  LOWER EXTREMITY MMT:  MMT Right eval  Left eval  Hip flexion 4/5 4+/5  Hip extension    Hip abduction    Hip adduction    Hip internal rotation    Hip external rotation    Knee flexion 4-/5 4-/5  Knee extension 4-/5 4/5  Ankle dorsiflexion 4/5 4/5  Ankle plantarflexion    Ankle inversion    Ankle eversion     (Blank rows = not tested)  GAIT: Assistive device utilized: Crutches Level of assistance: Modified independence Comments: wide base of support, knee flexion in stance phase bilaterally, poor foot clearance bilaterally, decreased stride length, and gait speed   TODAY'S TREATMENT:                                                                                                                              DATE:                                     05/08/23 EXERCISE LOG  Exercise Repetitions and Resistance Comments  Rocker board (seated)  4 minutes RLE only   Seated hip ADD isometric  20 reps w/ 5 second hold    Seated heel slide 25 reps  RLE only  Seated hip flexion  10 reps    LAQ 20 reps RLE only   Seated HS curl  Yellow t-band x 20 reps    Resisted PF  Red t-band x 20 reps  RLE only   Blank cell = exercise not performed today                                    04/30/23 EXERCISE LOG  Exercise Repetitions and Resistance Comments  Nustep  L1 x 5 minutes Limited by left knee crepitus  Seated hip ADD isometric  20 reps w/ 5 second hold   Rocker board (seated)  4.5 minutes   Seated HS isometric  2.5 minutes w/ 5 second hold    Seated clams 2 minutes   LAQ 3 minutes  RLE only  Seated heel slide  2.5 minutes  RLE only   Blank cell = exercise not performed today   PATIENT EDUCATION:  Education details: HEP,  exercise pacing, and expectations for soreness  Person educated: Patient Education method: Explanation Education comprehension: verbalized understanding  HOME EXERCISE PROGRAM: [id number]  ASSESSMENT:  CLINICAL IMPRESSION: Today's treatment focused on familiar interventions for improved lower  extremity strength and endurance with moderate difficulty. He fatigued quickly with today's treatment and he required brief rest breaks throughout treatment. He was educated on the expectations for muscle soreness following exercise. He was also educated on the benefits of pacing himself with these exercises to avoid a significant increase in soreness. He reported feeling alright upon the conclusion of treatment. He continues to require skilled physical therapy to address is remaining impairments to maximize his functional mobility.   OBJECTIVE IMPAIRMENTS: Abnormal gait, decreased activity tolerance, decreased mobility, difficulty walking, decreased ROM, decreased strength, hypomobility, impaired tone, and pain.   ACTIVITY LIMITATIONS: carrying, lifting, standing, squatting, stairs, transfers, bathing, toileting, dressing, and locomotion level  PARTICIPATION LIMITATIONS: meal prep, cleaning, laundry, driving, shopping, community activity, occupation, and yard work  PERSONAL FACTORS: Past/current experiences, Time since onset of injury/illness/exacerbation, and 3+ comorbidities: Hypertension, obesity, chronic low back pain, and osteoarthritis  are also affecting patient's functional outcome.   REHAB POTENTIAL: Good  CLINICAL DECISION MAKING: Evolving/moderate complexity  EVALUATION COMPLEXITY: Moderate   GOALS: Goals reviewed with patient? Yes  LONG TERM GOALS: Target date: 06/02/22  Patient will be independent with his HEP.  Baseline:  Goal status: INITIAL  2.  Patient will be able to complete his daily activities without his familiar symptoms exceeding 7/10. Baseline:  Goal status: INITIAL  3.  Patient will report being able to drive for at least 1 hour without being limited by his familiar symptoms to return to his job.  Baseline:  Goal status: INITIAL  4.  Patient will report being able to stand and walk for at least 20 minutes for improved function with his daily activities.   Baseline:  Goal status: INITIAL  5.  Patient will improve his active right knee extension to within 5 degrees of neutral for improved gait mechanics.  Baseline:  Goal status: INITIAL  PLAN:  PT FREQUENCY: 1x/week  PT DURATION: 6 weeks  PLANNED INTERVENTIONS: 97164- PT Re-evaluation, 97110-Therapeutic exercises, 97530- Therapeutic activity, 97112- Neuromuscular re-education, 97535- Self Care, 16109- Manual therapy, [phone]- Gait training, 97014- Electrical stimulation (unattended), 97016- Vasopneumatic device, Patient/Family education, Balance training, Stair training, Taping, Joint mobilization, Cryotherapy, and Moist heat  PLAN FOR NEXT SESSION: Nustep, lower extremity isometrics, manual therapy, and modalities as needed   Granville Lewis, PT 05/08/2023, 4:33 PM

## 2023-05-14 DIAGNOSIS — Z0189 Encounter for other specified special examinations: Secondary | ICD-10-CM | POA: Diagnosis not present

## 2023-05-14 DIAGNOSIS — N390 Urinary tract infection, site not specified: Secondary | ICD-10-CM | POA: Diagnosis not present

## 2023-05-15 ENCOUNTER — Ambulatory Visit: Payer: No Typology Code available for payment source

## 2023-05-15 DIAGNOSIS — R262 Difficulty in walking, not elsewhere classified: Secondary | ICD-10-CM

## 2023-05-15 DIAGNOSIS — M25561 Pain in right knee: Secondary | ICD-10-CM | POA: Diagnosis not present

## 2023-05-15 DIAGNOSIS — M6281 Muscle weakness (generalized): Secondary | ICD-10-CM

## 2023-05-15 NOTE — Therapy (Signed)
OUTPATIENT PHYSICAL THERAPY LOWER EXTREMITY TREATMENT   Patient Name: Joe Vance MRN: [id number] DOB:1951/04/20, 72 y.o., male Today's Date: 05/15/2023  END OF SESSION:  PT End of Session - 05/15/23 1350     Visit Number 4    Number of Visits 6    Date for PT Re-Evaluation 06/07/23    PT Start Time 1346    PT Stop Time 1428    PT Time Calculation (min) 42 min    Activity Tolerance Patient tolerated treatment well    Behavior During Therapy WFL for tasks assessed/performed                Past Medical History:  Diagnosis Date   BPH (benign prostatic hyperplasia)    Complication of anesthesia    GERD (gastroesophageal reflux disease)    HTN (hypertension)    Hyperlipidemia    Migraines    Morbid obesity (HCC)    Osteoarthritis    Osteoarthritis of shoulder    End stage- Right   PONV (postoperative nausea and vomiting)    Sleep apnea    Cpap   Past Surgical History:  Procedure Laterality Date   CHOLECYSTECTOMY     INGUINAL HERNIA REPAIR     x 2   KNEE SURGERY     x 2   REVERSE SHOULDER ARTHROPLASTY Right 04/04/2018   Procedure: REVERSE SHOULDER ARTHROPLASTY;  Surgeon: Beverely Low, MD;  Location: [facility] OR;  Service: Orthopedics;  Laterality: Right;   Patient Active Problem List   Diagnosis Date Noted   S/P shoulder replacement, right 04/04/2018   Low back pain 07/23/2017   Myalgia 07/23/2017   Inflammatory polyarthropathy (HCC) 07/23/2017   LBBB (left bundle branch block) 08/08/2011   Morbid obesity (HCC)    HTN (hypertension)    Hyperlipidemia    REFERRING PROVIDER: Grafton Folk, MD   REFERRING DIAG: Patellar tendinitis, right knee   THERAPY DIAG:  Acute pain of right knee  Muscle weakness (generalized)  Difficulty in walking, not elsewhere classified  Rationale for Evaluation and Treatment: Rehabilitation  ONSET DATE: 02/06/23  SUBJECTIVE:   SUBJECTIVE STATEMENT: Patient reports that he was really sore after his last appointment.  He notes that his right hamstring is still sore.   PERTINENT HISTORY: Hypertension, obesity, chronic low back pain, and osteoarthritis PAIN:  Are you having pain? Yes: NPRS scale: 3/10 Pain location: anterior right knee  Pain description: aching, throbbing, with intermittent sharp pain Aggravating factors: walking, using the restroom, and transfers  Relieving factors: sitting, lidocaine patches  PRECAUTIONS: None  RED FLAGS: None   WEIGHT BEARING RESTRICTIONS: No  FALLS:  Has patient fallen in last 6 months? 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  PATIENT SURVEYS:  FOTO 39.21 on 05/15/23  COGNITION: Overall cognitive status: Within functional limits for tasks assessed     SENSATION: Patient reports no numbness or tingling  PALPATION: TTP: right patella,  patellar tendon, quadriceps tendon, hamstring, and quadriceps  LOWER EXTREMITY ROM: assessed in sitting  Active ROM Right eval Left eval; wearing knee brace  Hip flexion    Hip extension    Hip abduction    Hip adduction    Hip internal rotation    Hip external rotation    Knee flexion 103 87  Knee extension 10 10  Ankle dorsiflexion    Ankle plantarflexion    Ankle inversion    Ankle eversion     (Blank rows = not tested)  LOWER EXTREMITY MMT:  MMT Right eval Left eval  Hip flexion 4/5 4+/5   Hip extension    Hip abduction    Hip adduction    Hip internal rotation    Hip external rotation    Knee flexion 4-/5 4-/5  Knee extension 4-/5 4/5  Ankle dorsiflexion 4/5 4/5  Ankle plantarflexion    Ankle inversion    Ankle eversion     (Blank rows = not tested)  GAIT: Assistive device utilized: Crutches Level of assistance: Modified independence Comments: wide base of support, knee flexion in stance phase bilaterally, poor foot clearance bilaterally, decreased stride length, and gait speed   TODAY'S TREATMENT:                                                                                                                              DATE:                                     05/15/23 EXERCISE LOG  Exercise Repetitions and Resistance Comments  Rocker board (seated) 5 minutes  BLE   LAQ 2.5 minutes Alternating LE  Seated HS isometric  X 1.5 minutes each  On BLE  Toe taps on step  6" step x 4 minutes Alternating LE  Seated hip ADD isometric  4 minutes w/ 5 second hold     Blank cell = exercise not performed today                                    05/08/23 EXERCISE LOG  Exercise Repetitions and Resistance Comments  Rocker board (seated)  4 minutes RLE only   Seated hip ADD isometric  20 reps w/ 5 second hold    Seated heel slide 25 reps  RLE only  Seated hip flexion  10 reps    LAQ 20 reps RLE only   Seated HS curl  Yellow t-band x 20 reps    Resisted PF  Red t-band x 20 reps  RLE only   Blank cell = exercise not performed today  04/30/23 EXERCISE LOG  Exercise Repetitions and Resistance Comments  Nustep  L1 x 5 minutes Limited by left knee crepitus  Seated hip ADD isometric  20 reps w/ 5 second hold   Rocker board (seated)  4.5 minutes   Seated HS isometric  2.5 minutes w/ 5 second hold    Seated clams 2 minutes   LAQ 3 minutes  RLE only  Seated heel slide  2.5 minutes  RLE only   Blank cell = exercise not performed today    PATIENT EDUCATION:  Education details: HEP, exercise pacing, and expectations for soreness  Person educated: Patient Education method: Explanation Education comprehension: verbalized understanding  HOME EXERCISE PROGRAM: [id number]  ASSESSMENT:  CLINICAL IMPRESSION: Patient was progressed with familiar interventions for improved lower extremity strength and endurance. He required minimal cueing with these interventions for proper pacing. He was educated on how to safely progress his tolerance to household activities. He reported understanding. He experienced no significant increase in soreness or discomfort with any of today's interventions. He reported feeling better upon the conclusion of treatment. He continues to require skilled physical therapy to address his remaining impairments to maximize his functional mobility.   OBJECTIVE IMPAIRMENTS: Abnormal gait, decreased activity tolerance, decreased mobility, difficulty walking, decreased ROM, decreased strength, hypomobility, impaired tone, and pain.   ACTIVITY LIMITATIONS: carrying, lifting, standing, squatting, stairs, transfers, bathing, toileting, dressing, and locomotion level  PARTICIPATION LIMITATIONS: meal prep, cleaning, laundry, driving, shopping, community activity, occupation, and yard work  PERSONAL FACTORS: Past/current experiences, Time since onset of injury/illness/exacerbation, and 3+ comorbidities: Hypertension, obesity, chronic low back pain, and osteoarthritis  are also affecting patient's functional outcome.   REHAB POTENTIAL: Good  CLINICAL DECISION MAKING: Evolving/moderate complexity  EVALUATION COMPLEXITY: Moderate   GOALS: Goals reviewed with patient? Yes  LONG TERM GOALS: Target date: 06/02/22  Patient will be independent with his HEP.  Baseline:  Goal status: INITIAL  2.  Patient will be able to complete his daily activities without his familiar symptoms exceeding 7/10. Baseline:  Goal status:  INITIAL  3.  Patient will report being able to drive for at least 1 hour without being limited by his familiar symptoms to return to his job.  Baseline: 15-20 minutes (the most he has driven in recent weeks due to need) Goal status: INITIAL  4.  Patient will report being able to stand and walk for at least 20 minutes for improved function with his daily activities.  Baseline: 5 minutes Goal status: ON GOING  5.  Patient will improve his active right knee extension to within 5 degrees of neutral for improved gait mechanics.  Baseline:  Goal status: INITIAL  PLAN:  PT FREQUENCY: 1x/week  PT DURATION: 6 weeks  PLANNED INTERVENTIONS: 97164- PT Re-evaluation, 97110-Therapeutic exercises, 97530- Therapeutic activity, 97112- Neuromuscular re-education, 97535- Self Care, 16109- Manual therapy, [phone]- Gait training, 97014- Electrical stimulation (unattended), 97016- Vasopneumatic device, Patient/Family education, Balance training, Stair training, Taping, Joint mobilization, Cryotherapy, and Moist heat  PLAN FOR NEXT SESSION: Nustep, lower extremity isometrics, manual therapy, and modalities as needed   Granville Lewis, PT 05/15/2023, 3:38 PM

## 2023-05-28 ENCOUNTER — Ambulatory Visit: Payer: No Typology Code available for payment source

## 2023-05-28 DIAGNOSIS — M25561 Pain in right knee: Secondary | ICD-10-CM | POA: Diagnosis not present

## 2023-05-28 DIAGNOSIS — R262 Difficulty in walking, not elsewhere classified: Secondary | ICD-10-CM

## 2023-05-28 DIAGNOSIS — M6281 Muscle weakness (generalized): Secondary | ICD-10-CM

## 2023-05-28 NOTE — Therapy (Signed)
 OUTPATIENT PHYSICAL THERAPY LOWER EXTREMITY TREATMENT   Patient Name: Joe Vance MRN: [id number] DOB:November 01, 1950, 72 y.o., male Today's Date: 05/28/2023  END OF SESSION:  PT End of Session - 05/28/23 1350     Visit Number 5    Number of Visits 6    Date for PT Re-Evaluation 06/07/23    PT Start Time 1345    PT Stop Time 1414    PT Time Calculation (min) 29 min    Activity Tolerance Patient tolerated treatment well    Behavior During Therapy WFL for tasks assessed/performed                 Past Medical History:  Diagnosis Date   BPH (benign prostatic hyperplasia)    Complication of anesthesia    GERD (gastroesophageal reflux disease)    HTN (hypertension)    Hyperlipidemia    Migraines    Morbid obesity (HCC)    Osteoarthritis    Osteoarthritis of shoulder    End stage- Right   PONV (postoperative nausea and vomiting)    Sleep apnea    Cpap   Past Surgical History:  Procedure Laterality Date   CHOLECYSTECTOMY     INGUINAL HERNIA REPAIR     x 2   KNEE SURGERY     x 2   REVERSE SHOULDER ARTHROPLASTY Right 04/04/2018   Procedure: REVERSE SHOULDER ARTHROPLASTY;  Surgeon: Kay Kemps, MD;  Location: [facility] OR;  Service: Orthopedics;  Laterality: Right;   Patient Active Problem List   Diagnosis Date Noted   S/P shoulder replacement, right 04/04/2018   Low back pain 07/23/2017   Myalgia 07/23/2017   Inflammatory polyarthropathy (HCC) 07/23/2017   LBBB (left bundle branch block) 08/08/2011   Morbid obesity (HCC)    HTN (hypertension)    Hyperlipidemia    REFERRING PROVIDER: Canda Franky Jurist, MD   REFERRING DIAG: Patellar tendinitis, right knee   THERAPY DIAG:  Acute pain of right knee  Muscle weakness (generalized)  Difficulty in walking, not elsewhere classified  Rationale for Evaluation and Treatment: Rehabilitation  ONSET DATE: 02/06/23  SUBJECTIVE:   SUBJECTIVE STATEMENT: Patient reports that his legs and back have been hurting more  recently. He notes that he hurts every time after his therapy appointments.   PERTINENT HISTORY: Hypertension, obesity, chronic low back pain, and osteoarthritis PAIN:  Are you having pain? Yes: NPRS scale: 2/10 Pain location: anterior right knee  Pain description: aching, throbbing, with intermittent sharp pain Aggravating factors: walking, using the restroom, and transfers  Relieving factors: sitting, lidocaine  patches  PRECAUTIONS: None  RED FLAGS: None   WEIGHT BEARING RESTRICTIONS: No  FALLS:  Has patient fallen in last 6 months? 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  PATIENT SURVEYS:  FOTO 39.21 on 05/15/23  COGNITION: Overall cognitive status: Within functional limits for tasks assessed     SENSATION: Patient reports no numbness or tingling  PALPATION: TTP: right patella, patellar tendon, quadriceps tendon, hamstring, and quadriceps  LOWER EXTREMITY ROM: assessed in sitting  Active ROM Right eval Left eval; wearing knee brace  Hip flexion    Hip extension    Hip abduction    Hip adduction    Hip internal rotation    Hip external rotation    Knee flexion 103 87  Knee extension 10 10  Ankle dorsiflexion    Ankle plantarflexion    Ankle inversion    Ankle eversion     (Blank rows = not tested)  LOWER EXTREMITY MMT:  MMT Right eval  Left eval  Hip flexion 4/5 4+/5  Hip extension    Hip abduction    Hip adduction    Hip internal rotation    Hip external rotation    Knee flexion 4-/5 4-/5  Knee extension 4-/5 4/5  Ankle dorsiflexion 4/5 4/5  Ankle plantarflexion    Ankle inversion    Ankle eversion     (Blank rows = not tested)  GAIT: Assistive device utilized: Crutches Level of assistance: Modified independence Comments: wide base of support, knee flexion in stance phase bilaterally, poor foot clearance bilaterally, decreased stride length, and gait speed   TODAY'S TREATMENT:                                                                                                                              DATE:                                     05/28/23 EXERCISE LOG  Exercise Repetitions and Resistance Comments  Seated hip ADD isometric  3 minutes w/ 5 second hold    LAQ 15 reps each    Seated heel slide  20 reps each    Rocker board (seated)  25 reps each    Seated marching  15 reps each    Seated clams  20 reps     Blank cell = exercise not performed today                                    05/15/23 EXERCISE LOG  Exercise Repetitions and Resistance Comments  Rocker board (seated) 5 minutes  BLE   LAQ 2.5 minutes Alternating LE  Seated HS isometric  X 1.5 minutes each  On BLE  Toe taps on step  6 step x 4 minutes Alternating LE  Seated hip ADD isometric  4 minutes w/ 5 second hold     Blank cell = exercise not performed today                                    05/08/23 EXERCISE LOG  Exercise Repetitions and  Resistance Comments  Rocker board (seated)  4 minutes RLE only   Seated hip ADD isometric  20 reps w/ 5 second hold    Seated heel slide 25 reps  RLE only  Seated hip flexion  10 reps    LAQ 20 reps RLE only   Seated HS curl  Yellow t-band x 20 reps    Resisted PF  Red t-band x 20 reps  RLE only   Blank cell = exercise not performed today   PATIENT EDUCATION:  Education details: HEP,  exercise pacing, and expectations for soreness  Person educated: Patient Education method: Explanation Education comprehension: verbalized understanding  HOME EXERCISE PROGRAM: [id number]  ASSESSMENT:  CLINICAL IMPRESSION: Today's treatment focused on familiar interventions for improved lower extremity muscular strength and endurance with moderate difficulty. He required minimal cueing with pacing for today's interventions to avoid aggravating his familiar symptoms. He reported that his knees did not feel as tight upon the conclusion of treatment. His progress with skilled physical therapy will be reassessed to determine the effectiveness of physical therapy and the appropriate next steps in his plan of care.   OBJECTIVE IMPAIRMENTS: Abnormal gait, decreased activity tolerance, decreased mobility, difficulty walking, decreased ROM, decreased strength, hypomobility, impaired tone, and pain.   ACTIVITY LIMITATIONS: carrying, lifting, standing, squatting, stairs, transfers, bathing, toileting, dressing, and locomotion level  PARTICIPATION LIMITATIONS: meal prep, cleaning, laundry, driving, shopping, community activity, occupation, and yard work  PERSONAL FACTORS: Past/current experiences, Time since onset of injury/illness/exacerbation, and 3+ comorbidities: Hypertension, obesity, chronic low back pain, and osteoarthritis  are also affecting patient's functional outcome.   REHAB POTENTIAL: Good  CLINICAL DECISION MAKING: Evolving/moderate complexity  EVALUATION COMPLEXITY: Moderate   GOALS: Goals reviewed with patient? Yes  LONG TERM GOALS: Target date: 06/02/22  Patient will be independent with his HEP.  Baseline: I haven't done a whole lot due to pain  Goal status: ON GOING  2.  Patient will be able to complete his daily activities without his familiar symptoms exceeding 7/10. Baseline:  Goal status: ON GOING  3.  Patient will report being able to drive for at least 1 hour  without being limited by his familiar symptoms to return to his job.  Baseline: 15-20 minutes (the most he has driven in recent weeks due to need) Goal status: INITIAL  4.  Patient will report being able to stand and walk for at least 20 minutes for improved function with his daily activities.  Baseline: 5 minutes Goal status: ON GOING  5.  Patient will improve his active right knee extension to within 5 degrees of neutral for improved gait mechanics.  Baseline:  Goal status: INITIAL  PLAN:  PT FREQUENCY: 1x/week  PT DURATION: 6 weeks  PLANNED INTERVENTIONS: 97164- PT Re-evaluation, 97110-Therapeutic exercises, 97530- Therapeutic activity, 97112- Neuromuscular re-education, 97535- Self Care, [id number]- Manual therapy, [phone]- Gait training, 97014- Electrical stimulation (unattended), 97016- Vasopneumatic device, Patient/Family education, Balance training, Stair training, Taping, Joint mobilization, Cryotherapy, and Moist heat  PLAN FOR NEXT SESSION: Nustep, lower extremity isometrics, manual therapy, and modalities as needed   Lacinda JAYSON Fass, PT 05/28/2023, 2:23 PM

## 2023-06-07 DIAGNOSIS — M1711 Unilateral primary osteoarthritis, right knee: Secondary | ICD-10-CM | POA: Diagnosis not present

## 2023-06-07 DIAGNOSIS — K219 Gastro-esophageal reflux disease without esophagitis: Secondary | ICD-10-CM | POA: Diagnosis not present

## 2023-06-07 DIAGNOSIS — R300 Dysuria: Secondary | ICD-10-CM | POA: Diagnosis not present

## 2023-06-07 DIAGNOSIS — Z0189 Encounter for other specified special examinations: Secondary | ICD-10-CM | POA: Diagnosis not present

## 2023-06-07 DIAGNOSIS — M47816 Spondylosis without myelopathy or radiculopathy, lumbar region: Secondary | ICD-10-CM | POA: Diagnosis not present

## 2023-06-07 DIAGNOSIS — K269 Duodenal ulcer, unspecified as acute or chronic, without hemorrhage or perforation: Secondary | ICD-10-CM | POA: Diagnosis not present

## 2023-06-11 ENCOUNTER — Encounter: Payer: No Typology Code available for payment source | Admitting: *Deleted

## 2023-08-01 DIAGNOSIS — M25569 Pain in unspecified knee: Secondary | ICD-10-CM | POA: Diagnosis not present

## 2023-08-01 DIAGNOSIS — R7989 Other specified abnormal findings of blood chemistry: Secondary | ICD-10-CM | POA: Diagnosis not present

## 2023-08-01 DIAGNOSIS — G4700 Insomnia, unspecified: Secondary | ICD-10-CM | POA: Diagnosis not present

## 2023-08-01 DIAGNOSIS — K219 Gastro-esophageal reflux disease without esophagitis: Secondary | ICD-10-CM | POA: Diagnosis not present

## 2023-08-01 DIAGNOSIS — I10 Essential (primary) hypertension: Secondary | ICD-10-CM | POA: Diagnosis not present

## 2023-09-13 DIAGNOSIS — M5416 Radiculopathy, lumbar region: Secondary | ICD-10-CM | POA: Diagnosis not present

## 2023-10-30 DIAGNOSIS — M545 Low back pain, unspecified: Secondary | ICD-10-CM | POA: Diagnosis not present

## 2023-10-30 DIAGNOSIS — E291 Testicular hypofunction: Secondary | ICD-10-CM | POA: Diagnosis not present

## 2023-10-30 DIAGNOSIS — N400 Enlarged prostate without lower urinary tract symptoms: Secondary | ICD-10-CM | POA: Diagnosis not present

## 2023-10-30 DIAGNOSIS — K219 Gastro-esophageal reflux disease without esophagitis: Secondary | ICD-10-CM | POA: Diagnosis not present

## 2023-12-17 DIAGNOSIS — M5416 Radiculopathy, lumbar region: Secondary | ICD-10-CM | POA: Diagnosis not present

## 2024-06-15 ENCOUNTER — Other Ambulatory Visit: Payer: Self-pay

## 2024-06-15 ENCOUNTER — Encounter: Payer: Self-pay | Admitting: Physical Therapy

## 2024-06-15 ENCOUNTER — Ambulatory Visit: Admitting: Physical Therapy

## 2024-06-15 DIAGNOSIS — R262 Difficulty in walking, not elsewhere classified: Secondary | ICD-10-CM | POA: Insufficient documentation

## 2024-06-15 DIAGNOSIS — M6281 Muscle weakness (generalized): Secondary | ICD-10-CM | POA: Insufficient documentation

## 2024-06-15 DIAGNOSIS — M25561 Pain in right knee: Secondary | ICD-10-CM | POA: Insufficient documentation

## 2024-06-15 NOTE — Therapy (Unsigned)
 " OUTPATIENT PHYSICAL THERAPY THORACOLUMBAR EVALUATION   Patient Name: Joe Vance MRN: [id number] DOB:December 12, 1950, 74 y.o., male Today's Date: 06/15/2024  END OF SESSION:  PT End of Session - 06/15/24 1430     Visit Number 1    Authorization Type [place]    PT Start Time 1430    PT Stop Time 1510    PT Time Calculation (min) 40 min    Activity Tolerance Patient tolerated treatment well          Past Medical History:  Diagnosis Date   BPH (benign prostatic hyperplasia)    Complication of anesthesia    GERD (gastroesophageal reflux disease)    HTN (hypertension)    Hyperlipidemia    Migraines    Morbid obesity (HCC)    Osteoarthritis    Osteoarthritis of shoulder    End stage- Right   PONV (postoperative nausea and vomiting)    Sleep apnea    Cpap   Past Surgical History:  Procedure Laterality Date   CHOLECYSTECTOMY     INGUINAL HERNIA REPAIR     x 2   KNEE SURGERY     x 2   REVERSE SHOULDER ARTHROPLASTY Right 04/04/2018   Procedure: REVERSE SHOULDER ARTHROPLASTY;  Surgeon: Kay Kemps, MD;  Location: [facility] OR;  Service: Orthopedics;  Laterality: Right;   Patient Active Problem List   Diagnosis Date Noted   S/P shoulder replacement, right 04/04/2018   Low back pain 07/23/2017   Myalgia 07/23/2017   Inflammatory polyarthropathy (HCC) 07/23/2017   LBBB (left bundle branch block) 08/08/2011   Morbid obesity (HCC)    HTN (hypertension)    Hyperlipidemia     PCP: Zollie Lowers, MD  REFERRING PROVIDER: Darnella Dorn SAUNDERS, MD  REFERRING DIAG: M43.16 Spondylolisthesis at L4-L5 level  Rationale for Evaluation and Treatment: Rehabilitation  THERAPY DIAG:  Acute pain of right knee  Muscle weakness (generalized)  Difficulty in walking, not elsewhere classified  ONSET DATE: Dec 2024  SUBJECTIVE:                                                                                                                                                                                            SUBJECTIVE STATEMENT: Pt states the goal is to lose weight to get lumbar surgery. Has been getting injections for his back to manage pain. Pt reports he woke up one morning in 2024 and couldn't walk. Was out for 3 months. No falls or injuries. Was able to work up to Dec 2025 but states it was a struggle. Had another episode in Dec 2025 and has since retired. States he has problems with  his low back, hips, buttock, thigh, hamstring, groin and R knee. Has had nerve ablations. Has had instances of near falls; wife has been assisting at home. Epidurals usually help and last 10-12 weeks. Unable to lift L hip into flexion  PERTINENT HISTORY:  2 knee surgeries, 2 hernia, shoulder replacement, high blood pressure  PAIN:  Are you having pain? Yes: NPRS scale: 5-8 Pain location: low back pain (R>L) Pain description: Radiates to groin/front of leg, buttock, foot; constant pain and sometimes sharp/shooting Aggravating factors: Sit to stand (especially on/off toilet), standing and walking Relieving factors: epidurals, sitting or laying, pain medication (hydrocodone )  PRECAUTIONS: Fall  RED FLAGS: None   WEIGHT BEARING RESTRICTIONS: No  FALLS:  Has patient fallen in last 6 months? Yes. Number of falls 3 -- ~2-3 weeks ago; has had a lot of near falls  LIVING ENVIRONMENT: Lives with: lives with their spouse Lives in: House/apartment Stairs: 3 to enter (has had history of falls on steps) Has following equipment at home: Lift chair, grab bars near toilet, raised bed  OCCUPATION: Retired estate manager/land agent  PLOF: Needs assistance with ADLs  PATIENT GOALS: Improve mobility, strength, reduce pain  NEXT MD VISIT: PRN  OBJECTIVE:  Note: Objective measures were completed at Evaluation unless otherwise noted.  DIAGNOSTIC FINDINGS:  Unable to see any recent MRI on Epic Lumbar MRI 2023 IMPRESSION: 1. L4-L5 moderate spinal canal stenosis and moderate bilateral neural foraminal narrowing.  Effacement of the lateral recesses at this level likely compresses the descending L5 nerve roots. 2. L5-S1 moderate to severe right and moderate left neural foraminal narrowing. 3. T12-L1 mild spinal canal stenosis and mild left neural foraminal narrowing. 4. L2-L3 mild spinal canal stenosis. Narrowing of the lateral recesses at this level could affect the descending L3 nerve roots.  PATIENT SURVEYS:  LEFS  Extreme difficulty/unable (0), Quite a bit of difficulty (1), Moderate difficulty (2), Little difficulty (3), No difficulty (4) Survey date:  06/15/24  Any of your usual work, housework or school activities 0  2. Usual hobbies, recreational or sporting activities 0  3. Getting into/out of the bath 0  4. Walking between rooms 0  5. Putting on socks/shoes 0  6. Squatting  0  7. Lifting an object, like a bag of groceries from the floor 0  8. Performing light activities around your home 0  9. Performing heavy activities around your home 0  10. Getting into/out of a car 0  11. Walking 2 blocks 0  12. Walking 1 mile 0  13. Going up/down 10 stairs (1 flight) 0  14. Standing for 1 hour 0  15.  sitting for 1 hour 3  16. Running on even ground 0  17. Running on uneven ground 0  18. Making sharp turns while running fast 0  19. Hopping  0  20. Rolling over in bed 0  Score total:  3/80    COGNITION: Overall cognitive status: Within functional limits for tasks assessed     SENSATION: WFL  MUSCLE LENGTH: Hamstrings: Right = Left Thomas test: Unable to tolerate  POSTURE: flexed trunk   PALPATION: Unable to assess  LUMBAR ROM:   AROM eval  Flexion   Extension   Right lateral flexion   Left lateral flexion   Right rotation   Left rotation    (Blank rows = not tested)  LOWER EXTREMITY ROM:  R hip IR limited vs L hip  Active / Passive Right eval Left eval  Hip flexion ~90  deg / 100 deg ~105 deg / 110 deg  Hip extension    Hip abduction    Hip adduction    Hip  internal rotation    Hip external rotation    Knee flexion    Knee extension    Ankle dorsiflexion    Ankle plantarflexion    Ankle inversion    Ankle eversion     (Blank rows = not tested)  LOWER EXTREMITY MMT:    MMT Right eval Left eval  Hip flexion 2+ 3  Hip extension 3- 3-  Hip abduction 3+ 3  Hip adduction    Hip internal rotation    Hip external rotation    Knee flexion 3+ 3+  Knee extension 3+ 3+  Ankle dorsiflexion    Ankle plantarflexion    Ankle inversion    Ankle eversion     (Blank rows = not tested)  LUMBAR SPECIAL TESTS:  Did not assess  TRANSFERS:  Supine to/from sit mod A for trunk negotiation and LE negotiation  Sit<>stand requires min A at times from low positioning for lift off  FUNCTIONAL TESTS:  5 times sit to stand: 1 min 10 sec with UE assist TUG: 29 sec with rollator  GAIT: Distance walked: Into clinic Assistive device utilized: Walker - 4 wheeled Level of assistance: SBA Comments: Forward trunk lean with increased UE support on rollator; decreased R heel strike and swing phase  TREATMENT DATE:     06/15/24 Bent knee fall outs x10 R hip PROM into flexion and ER  PATIENT EDUCATION:  Education details: Exam findings, POC, initiating an HEP Person educated: Patient and Spouse Education method: Explanation, Demonstration, and Handouts Education comprehension: verbalized understanding, returned demonstration, and needs further education  HOME EXERCISE PROGRAM: To be initiated formally  ASSESSMENT:  CLINICAL IMPRESSION: Patient is a 74 y.o. M who was seen today for physical therapy evaluation and treatment for low back and R>L hip/LE pain. PMH is significant for history of bilat knee arthritis/pain, obesity and chronic back pain for ~1 year. Assessment is significant for lumbar and R>L hip hypomobility, R>L LE weakness and core weakness affecting pt's balance/safety, ability to perform transfers and tolerate weight bearing. Pt is a high  fall risk based on his 5x STS and TUG score. Pt will benefit from PT to address these deficits and maximize his level of function.   OBJECTIVE IMPAIRMENTS: Abnormal gait, decreased activity tolerance, decreased balance, decreased endurance, decreased mobility, difficulty walking, decreased ROM, decreased strength, increased fascial restrictions, impaired flexibility, improper body mechanics, postural dysfunction, obesity, and pain.   ACTIVITY LIMITATIONS: carrying, lifting, bending, standing, squatting, stairs, transfers, bed mobility, bathing, toileting, dressing, hygiene/grooming, locomotion level, and caring for others  PARTICIPATION LIMITATIONS: meal prep, cleaning, laundry, shopping, community activity, occupation, and yard work  PERSONAL FACTORS: Age, Fitness, Past/current experiences, and Time since onset of injury/illness/exacerbation are also affecting patient's functional outcome.   REHAB POTENTIAL: Good  CLINICAL DECISION MAKING: Evolving/moderate complexity  EVALUATION COMPLEXITY: Moderate   GOALS: Goals reviewed with patient? Yes  SHORT TERM GOALS: Target date: 07/14/2024   Pt will be ind with initial HEP Baseline: Goal status: INITIAL  2.  Pt will be able to lift R hip > 90 deg in sitting position Baseline:  Goal status: INITIAL    LONG TERM GOALS: Target date: 08/11/2024   Pt will be ind with management and progression of HEP Baseline:  Goal status: INITIAL  2.  Pt will have improved 5 x STS to </=45  sec to demo increasing functional LE strength with UE support as needed Baseline:  Goal status: INITIAL  3.  Pt will have improved TUG to </=20 sec to reduce fall risk Baseline:  Goal status: INITIAL  4.  Pt will have improved LEFS to >/=15 to demo MCID Baseline:  Goal status: INITIAL  5.  Pt will be able to perform bed transfers independently without his wife's assistance Baseline:  Goal status: INITIAL   PLAN:  PT FREQUENCY: 2x/week  PT  DURATION: 8 weeks  PLANNED INTERVENTIONS: [id number]- PT Re-evaluation, 97750- Physical Performance Testing, 97110-Therapeutic exercises, 97530- Therapeutic activity, 97112- Neuromuscular re-education, 97535- Self Care, [id number]- Manual therapy, [id number]- Gait training, [phone]- Aquatic Therapy, [phone]- Electrical stimulation (unattended), [id number]- Ultrasound, [id number]- Traction (mechanical), [id number]- Ionotophoresis 4mg /ml Dexamethasone , [id number] (1-2 muscles), [id number] (3+ muscles)- Dry Needling, Patient/Family education, Balance training, Stair training, Taping, Joint mobilization, Spinal mobilization, Cryotherapy, and Moist heat.  PLAN FOR NEXT SESSION: R hip ROM/mobility/joint mobs. Core and hip strengthening. Initiate strengthening/stretching HEP.    Jessy Cybulski April Ma L Kevork Joyce, PT, DPT 06/15/2024, 2:31 PM  "

## 2024-06-22 ENCOUNTER — Ambulatory Visit: Admitting: Physical Therapy

## 2024-07-01 ENCOUNTER — Ambulatory Visit: Admitting: Physical Therapy

## 2024-07-02 ENCOUNTER — Ambulatory Visit: Admitting: Physical Therapy
